# Patient Record
Sex: Female | Born: 1985 | Race: Black or African American | Hispanic: No | Marital: Married | Smoking: Current every day smoker
Health system: Southern US, Community
[De-identification: ages and names within clinical notes are randomized; demographics above are authoritative.]

## PROBLEM LIST (undated history)

## (undated) DIAGNOSIS — N83202 Unspecified ovarian cyst, left side: Secondary | ICD-10-CM

## (undated) DIAGNOSIS — K219 Gastro-esophageal reflux disease without esophagitis: Secondary | ICD-10-CM

## (undated) DIAGNOSIS — N83201 Unspecified ovarian cyst, right side: Secondary | ICD-10-CM

## (undated) DIAGNOSIS — M0520 Rheumatoid vasculitis with rheumatoid arthritis of unspecified site: Secondary | ICD-10-CM

## (undated) DIAGNOSIS — E559 Vitamin D deficiency, unspecified: Secondary | ICD-10-CM

## (undated) DIAGNOSIS — F419 Anxiety disorder, unspecified: Secondary | ICD-10-CM

## (undated) DIAGNOSIS — B009 Herpesviral infection, unspecified: Secondary | ICD-10-CM

## (undated) DIAGNOSIS — M25532 Pain in left wrist: Secondary | ICD-10-CM

## (undated) DIAGNOSIS — J45909 Unspecified asthma, uncomplicated: Secondary | ICD-10-CM

## (undated) DIAGNOSIS — M797 Fibromyalgia: Secondary | ICD-10-CM

## (undated) HISTORY — DX: Fibromyalgia: M79.7

## (undated) HISTORY — PX: THYROIDECTOMY: SHX17

## (undated) HISTORY — DX: Pain in left wrist: M25.532

## (undated) HISTORY — DX: Unspecified asthma, uncomplicated: J45.909

## (undated) HISTORY — DX: Vitamin D deficiency, unspecified: E55.9

## (undated) HISTORY — DX: Gastro-esophageal reflux disease without esophagitis: K21.9

## (undated) HISTORY — PX: ABDOMINAL HYSTERECTOMY: SHX81

---
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
## 2004-04-04 ENCOUNTER — Ambulatory Visit (HOSPITAL_COMMUNITY): Admission: RE | Admit: 2004-04-04 | Discharge: 2004-04-04 | Payer: Self-pay | Admitting: Obstetrics and Gynecology

## 2004-07-29 ENCOUNTER — Ambulatory Visit (HOSPITAL_COMMUNITY): Admission: AD | Admit: 2004-07-29 | Discharge: 2004-07-29 | Payer: Self-pay | Admitting: Obstetrics and Gynecology

## 2004-08-31 ENCOUNTER — Ambulatory Visit (HOSPITAL_COMMUNITY): Admission: AD | Admit: 2004-08-31 | Discharge: 2004-08-31 | Payer: Self-pay | Admitting: Obstetrics and Gynecology

## 2004-09-02 ENCOUNTER — Inpatient Hospital Stay (HOSPITAL_COMMUNITY): Admission: AD | Admit: 2004-09-02 | Discharge: 2004-09-05 | Payer: Self-pay | Admitting: Obstetrics and Gynecology

## 2004-09-02 ENCOUNTER — Ambulatory Visit (HOSPITAL_COMMUNITY): Admission: AD | Admit: 2004-09-02 | Discharge: 2004-09-02 | Payer: Self-pay | Admitting: Obstetrics and Gynecology

## 2004-10-28 ENCOUNTER — Emergency Department (HOSPITAL_COMMUNITY): Admission: EM | Admit: 2004-10-28 | Discharge: 2004-10-28 | Payer: Self-pay | Admitting: Emergency Medicine

## 2004-11-18 ENCOUNTER — Emergency Department (HOSPITAL_COMMUNITY): Admission: EM | Admit: 2004-11-18 | Discharge: 2004-11-18 | Payer: Self-pay | Admitting: Emergency Medicine

## 2005-03-16 ENCOUNTER — Ambulatory Visit: Admission: RE | Admit: 2005-03-16 | Discharge: 2005-03-16 | Payer: Self-pay | Admitting: Obstetrics and Gynecology

## 2005-03-18 ENCOUNTER — Ambulatory Visit: Payer: Self-pay | Admitting: Pulmonary Disease

## 2006-05-31 ENCOUNTER — Emergency Department (HOSPITAL_COMMUNITY): Admission: EM | Admit: 2006-05-31 | Discharge: 2006-05-31 | Payer: Self-pay | Admitting: Emergency Medicine

## 2006-12-29 ENCOUNTER — Emergency Department (HOSPITAL_COMMUNITY): Admission: EM | Admit: 2006-12-29 | Discharge: 2006-12-29 | Payer: Self-pay | Admitting: Emergency Medicine

## 2007-09-13 ENCOUNTER — Emergency Department (HOSPITAL_COMMUNITY): Admission: EM | Admit: 2007-09-13 | Discharge: 2007-09-13 | Payer: Self-pay | Admitting: Emergency Medicine

## 2008-01-29 ENCOUNTER — Emergency Department (HOSPITAL_COMMUNITY): Admission: EM | Admit: 2008-01-29 | Discharge: 2008-01-29 | Payer: Self-pay | Admitting: Emergency Medicine

## 2008-07-20 ENCOUNTER — Emergency Department (HOSPITAL_COMMUNITY): Admission: EM | Admit: 2008-07-20 | Discharge: 2008-07-20 | Payer: Self-pay | Admitting: Emergency Medicine

## 2008-08-06 ENCOUNTER — Emergency Department (HOSPITAL_COMMUNITY): Admission: EM | Admit: 2008-08-06 | Discharge: 2008-08-06 | Payer: Self-pay | Admitting: Emergency Medicine

## 2008-08-27 ENCOUNTER — Other Ambulatory Visit: Admission: RE | Admit: 2008-08-27 | Discharge: 2008-08-27 | Payer: Self-pay | Admitting: Obstetrics and Gynecology

## 2008-10-31 ENCOUNTER — Emergency Department (HOSPITAL_COMMUNITY): Admission: EM | Admit: 2008-10-31 | Discharge: 2008-10-31 | Payer: Self-pay | Admitting: Emergency Medicine

## 2008-12-12 ENCOUNTER — Encounter (INDEPENDENT_AMBULATORY_CARE_PROVIDER_SITE_OTHER): Payer: Self-pay | Admitting: *Deleted

## 2009-02-14 ENCOUNTER — Inpatient Hospital Stay (HOSPITAL_COMMUNITY): Admission: AD | Admit: 2009-02-14 | Discharge: 2009-02-21 | Payer: Self-pay | Admitting: Obstetrics and Gynecology

## 2009-02-14 ENCOUNTER — Ambulatory Visit: Payer: Self-pay | Admitting: Family Medicine

## 2009-05-23 ENCOUNTER — Emergency Department (HOSPITAL_COMMUNITY): Admission: EM | Admit: 2009-05-23 | Discharge: 2009-05-23 | Payer: Self-pay | Admitting: Emergency Medicine

## 2009-08-25 ENCOUNTER — Emergency Department (HOSPITAL_COMMUNITY): Admission: EM | Admit: 2009-08-25 | Discharge: 2009-08-25 | Payer: Self-pay | Admitting: Emergency Medicine

## 2010-01-11 ENCOUNTER — Emergency Department (HOSPITAL_COMMUNITY): Admission: EM | Admit: 2010-01-11 | Discharge: 2010-01-11 | Payer: Self-pay | Admitting: Emergency Medicine

## 2010-02-12 ENCOUNTER — Encounter: Payer: Self-pay | Admitting: Obstetrics & Gynecology

## 2010-02-12 ENCOUNTER — Ambulatory Visit (HOSPITAL_COMMUNITY): Admission: RE | Admit: 2010-02-12 | Discharge: 2010-02-13 | Payer: Self-pay | Admitting: Obstetrics & Gynecology

## 2010-02-14 ENCOUNTER — Emergency Department (HOSPITAL_COMMUNITY): Admission: EM | Admit: 2010-02-14 | Discharge: 2010-02-14 | Payer: Self-pay | Admitting: Emergency Medicine

## 2010-04-28 ENCOUNTER — Inpatient Hospital Stay (HOSPITAL_COMMUNITY)
Admission: EM | Admit: 2010-04-28 | Discharge: 2010-04-28 | Payer: Self-pay | Source: Home / Self Care | Attending: Family Medicine | Admitting: Family Medicine

## 2010-07-10 ENCOUNTER — Emergency Department (HOSPITAL_COMMUNITY): Payer: Medicaid Other

## 2010-07-10 ENCOUNTER — Emergency Department (HOSPITAL_COMMUNITY)
Admission: EM | Admit: 2010-07-10 | Discharge: 2010-07-10 | Disposition: A | Discharge status: Home / Self Care | Payer: Medicaid Other | Attending: Emergency Medicine | Admitting: Emergency Medicine

## 2010-07-10 DIAGNOSIS — R0609 Other forms of dyspnea: Secondary | ICD-10-CM | POA: Insufficient documentation

## 2010-07-10 DIAGNOSIS — N390 Urinary tract infection, site not specified: Secondary | ICD-10-CM | POA: Insufficient documentation

## 2010-07-10 DIAGNOSIS — R059 Cough, unspecified: Secondary | ICD-10-CM | POA: Insufficient documentation

## 2010-07-10 DIAGNOSIS — R0989 Other specified symptoms and signs involving the circulatory and respiratory systems: Secondary | ICD-10-CM | POA: Insufficient documentation

## 2010-07-10 DIAGNOSIS — J45901 Unspecified asthma with (acute) exacerbation: Secondary | ICD-10-CM | POA: Insufficient documentation

## 2010-07-10 DIAGNOSIS — R05 Cough: Secondary | ICD-10-CM | POA: Insufficient documentation

## 2010-07-10 DIAGNOSIS — R071 Chest pain on breathing: Secondary | ICD-10-CM | POA: Insufficient documentation

## 2010-07-10 LAB — URINALYSIS, ROUTINE W REFLEX MICROSCOPIC
Nitrite: POSITIVE — AB
Protein, ur: NEGATIVE mg/dL
Urobilinogen, UA: 1 mg/dL (ref 0.0–1.0)

## 2010-07-10 LAB — URINE MICROSCOPIC-ADD ON

## 2010-07-10 LAB — POCT PREGNANCY, URINE: Preg Test, Ur: NEGATIVE

## 2010-07-29 LAB — URINALYSIS, ROUTINE W REFLEX MICROSCOPIC
Glucose, UA: NEGATIVE mg/dL
Hgb urine dipstick: NEGATIVE
Ketones, ur: NEGATIVE mg/dL
Protein, ur: NEGATIVE mg/dL
pH: 5.5 (ref 5.0–8.0)

## 2010-07-29 LAB — WET PREP, GENITAL
Clue Cells Wet Prep HPF POC: NONE SEEN
Trich, Wet Prep: NONE SEEN
Yeast Wet Prep HPF POC: NONE SEEN

## 2010-07-29 LAB — GC/CHLAMYDIA PROBE AMP, GENITAL: GC Probe Amp, Genital: NEGATIVE

## 2010-07-31 LAB — GC/CHLAMYDIA PROBE AMP, GENITAL
Chlamydia, DNA Probe: NEGATIVE
GC Probe Amp, Genital: NEGATIVE

## 2010-07-31 LAB — COMPREHENSIVE METABOLIC PANEL
AST: 15 U/L (ref 0–37)
Albumin: 3.6 g/dL (ref 3.5–5.2)
BUN: 2 mg/dL — ABNORMAL LOW (ref 6–23)
Chloride: 106 mEq/L (ref 96–112)
Creatinine, Ser: 0.71 mg/dL (ref 0.4–1.2)
GFR calc Af Amer: >60 mL/min (ref >60)
Total Bilirubin: 0.5 mg/dL (ref 0.3–1.2)
Total Protein: 6.7 g/dL (ref 6.0–8.3)

## 2010-07-31 LAB — URINALYSIS, ROUTINE W REFLEX MICROSCOPIC
Bilirubin Urine: NEGATIVE
Bilirubin Urine: NEGATIVE
Glucose, UA: NEGATIVE mg/dL
Ketones, ur: NEGATIVE mg/dL
Nitrite: NEGATIVE
Protein, ur: NEGATIVE mg/dL
Specific Gravity, Urine: 1.01 (ref 1.005–1.030)
Urobilinogen, UA: 1 mg/dL (ref 0.0–1.0)
Urobilinogen, UA: 1 mg/dL (ref 0.0–1.0)
pH: 8 (ref 5.0–8.0)

## 2010-07-31 LAB — CBC
Hemoglobin: 12.4 g/dL (ref 12.0–15.0)
MCH: 26.3 pg (ref 26.0–34.0)
MCH: 27 pg (ref 26.0–34.0)
MCHC: 32.9 g/dL (ref 30.0–36.0)
MCV: 82.1 fL (ref 78.0–100.0)
Platelets: 348 10*3/uL (ref 150–400)
RBC: 4.03 MIL/uL (ref 3.87–5.11)
RBC: 4.59 MIL/uL (ref 3.87–5.11)
RDW: 14.6 % (ref 11.5–15.5)
WBC: 6.4 10*3/uL (ref 4.0–10.5)

## 2010-07-31 LAB — URINE MICROSCOPIC-ADD ON

## 2010-07-31 LAB — SURGICAL PCR SCREEN
MRSA, PCR: NEGATIVE
Staphylococcus aureus: NEGATIVE

## 2010-07-31 LAB — DIFFERENTIAL
Basophils Relative: 1 % (ref 0–1)
Eosinophils Absolute: 0.3 10*3/uL (ref 0.0–0.7)
Monocytes Absolute: 0.4 10*3/uL (ref 0.1–1.0)
Neutrophils Relative %: 64 % (ref 43–77)

## 2010-07-31 LAB — BASIC METABOLIC PANEL
CO2: 27 mEq/L (ref 19–32)
Chloride: 106 mEq/L (ref 96–112)
Creatinine, Ser: 0.82 mg/dL (ref 0.4–1.2)
GFR calc Af Amer: >60 mL/min (ref >60)
Glucose, Bld: 86 mg/dL (ref 70–99)

## 2010-07-31 LAB — TYPE AND SCREEN
ABO/RH(D): B POS
Antibody Screen: NEGATIVE

## 2010-07-31 LAB — WET PREP, GENITAL
Trich, Wet Prep: NONE SEEN
Yeast Wet Prep HPF POC: NONE SEEN

## 2010-08-03 LAB — DIFFERENTIAL
Basophils Absolute: 0 10*3/uL (ref 0.0–0.1)
Basophils Relative: 0 % (ref 0–1)
Eosinophils Relative: 4 % (ref 0–5)
Lymphocytes Relative: 30 % (ref 12–46)
Neutro Abs: 4 10*3/uL (ref 1.7–7.7)

## 2010-08-03 LAB — URINALYSIS, ROUTINE W REFLEX MICROSCOPIC
Bilirubin Urine: NEGATIVE
Glucose, UA: NEGATIVE mg/dL
Ketones, ur: NEGATIVE mg/dL
Leukocytes, UA: NEGATIVE
Protein, ur: NEGATIVE mg/dL

## 2010-08-03 LAB — COMPREHENSIVE METABOLIC PANEL
Alkaline Phosphatase: 76 U/L (ref 39–117)
BUN: 9 mg/dL (ref 6–23)
CO2: 27 mEq/L (ref 19–32)
Chloride: 104 mEq/L (ref 96–112)
Creatinine, Ser: 0.68 mg/dL (ref 0.4–1.2)
GFR calc non Af Amer: >60 mL/min (ref >60)
Total Bilirubin: 0.3 mg/dL (ref 0.3–1.2)

## 2010-08-03 LAB — CBC
HCT: 41.4 % (ref 36.0–46.0)
Hemoglobin: 13.7 g/dL (ref 12.0–15.0)
MCV: 82.2 fL (ref 78.0–100.0)
RBC: 5.03 MIL/uL (ref 3.87–5.11)
WBC: 6.4 10*3/uL (ref 4.0–10.5)

## 2010-08-03 LAB — LIPASE, BLOOD: Lipase: 30 U/L (ref 11–59)

## 2010-08-06 LAB — URINALYSIS, ROUTINE W REFLEX MICROSCOPIC
Hgb urine dipstick: NEGATIVE
Ketones, ur: NEGATIVE mg/dL
Protein, ur: NEGATIVE mg/dL
Urobilinogen, UA: 0.2 mg/dL (ref 0.0–1.0)

## 2010-08-21 LAB — CBC
HCT: 31 % — ABNORMAL LOW (ref 36.0–46.0)
MCV: 83.9 fL (ref 78.0–100.0)
Platelets: 256 10*3/uL (ref 150–400)
RDW: 14.8 % (ref 11.5–15.5)
WBC: 15.3 10*3/uL — ABNORMAL HIGH (ref 4.0–10.5)

## 2010-08-21 LAB — RPR: RPR Ser Ql: NONREACTIVE

## 2010-08-22 LAB — STREP B DNA PROBE: Strep Group B Ag: POSITIVE

## 2010-08-22 LAB — GC/CHLAMYDIA PROBE AMP, URINE: Chlamydia, Swab/Urine, PCR: NEGATIVE

## 2010-08-25 LAB — DIFFERENTIAL
Basophils Absolute: 0 10*3/uL (ref 0.0–0.1)
Eosinophils Absolute: 0.1 10*3/uL (ref 0.0–0.7)
Eosinophils Relative: 1 % (ref 0–5)
Lymphocytes Relative: 16 % (ref 12–46)
Lymphs Abs: 1.6 10*3/uL (ref 0.7–4.0)
Monocytes Absolute: 0.5 10*3/uL (ref 0.1–1.0)

## 2010-08-25 LAB — URINALYSIS, ROUTINE W REFLEX MICROSCOPIC
Glucose, UA: NEGATIVE mg/dL
Ketones, ur: NEGATIVE mg/dL
Nitrite: NEGATIVE
Specific Gravity, Urine: 1.015 (ref 1.005–1.030)
pH: 7.5 (ref 5.0–8.0)

## 2010-08-25 LAB — CBC
HCT: 34.1 % — ABNORMAL LOW (ref 36.0–46.0)
Hemoglobin: 12 g/dL (ref 12.0–15.0)
MCV: 83.5 fL (ref 78.0–100.0)
Platelets: 268 10*3/uL (ref 150–400)
RDW: 15.4 % (ref 11.5–15.5)

## 2010-08-25 LAB — URINE MICROSCOPIC-ADD ON

## 2010-08-25 LAB — BASIC METABOLIC PANEL
BUN: 2 mg/dL — ABNORMAL LOW (ref 6–23)
CO2: 27 mEq/L (ref 19–32)
Chloride: 105 mEq/L (ref 96–112)
GFR calc non Af Amer: >60 mL/min (ref >60)
Glucose, Bld: 76 mg/dL (ref 70–99)
Potassium: 3.9 mEq/L (ref 3.5–5.1)
Sodium: 136 mEq/L (ref 135–145)

## 2010-08-25 LAB — URINE CULTURE

## 2010-08-28 ENCOUNTER — Emergency Department (HOSPITAL_COMMUNITY)
Admission: EM | Admit: 2010-08-28 | Discharge: 2010-08-28 | Disposition: A | Discharge status: Home / Self Care | Payer: Medicaid Other | Attending: Emergency Medicine | Admitting: Emergency Medicine

## 2010-08-28 DIAGNOSIS — J309 Allergic rhinitis, unspecified: Secondary | ICD-10-CM | POA: Insufficient documentation

## 2010-08-28 DIAGNOSIS — J45909 Unspecified asthma, uncomplicated: Secondary | ICD-10-CM | POA: Insufficient documentation

## 2010-08-28 DIAGNOSIS — F17200 Nicotine dependence, unspecified, uncomplicated: Secondary | ICD-10-CM | POA: Insufficient documentation

## 2010-08-28 DIAGNOSIS — Z9109 Other allergy status, other than to drugs and biological substances: Secondary | ICD-10-CM | POA: Insufficient documentation

## 2010-08-28 DIAGNOSIS — R300 Dysuria: Secondary | ICD-10-CM | POA: Insufficient documentation

## 2010-08-28 LAB — URINALYSIS, ROUTINE W REFLEX MICROSCOPIC
Bilirubin Urine: NEGATIVE
Bilirubin Urine: NEGATIVE
Glucose, UA: NEGATIVE mg/dL
Ketones, ur: NEGATIVE mg/dL
Ketones, ur: NEGATIVE mg/dL
Leukocytes, UA: NEGATIVE
Nitrite: POSITIVE — AB
Protein, ur: NEGATIVE mg/dL
Protein, ur: NEGATIVE mg/dL
Urobilinogen, UA: 0.2 mg/dL (ref 0.0–1.0)
pH: 6 (ref 5.0–8.0)

## 2010-08-28 LAB — HCG, QUANTITATIVE, PREGNANCY: hCG, Beta Chain, Quant, S: 31384 m[IU]/mL — ABNORMAL HIGH (ref <5)

## 2010-08-28 LAB — WET PREP, GENITAL
Clue Cells Wet Prep HPF POC: NONE SEEN
Trich, Wet Prep: NONE SEEN
WBC, Wet Prep HPF POC: NONE SEEN
Yeast Wet Prep HPF POC: NONE SEEN

## 2010-08-28 LAB — GC/CHLAMYDIA PROBE AMP, GENITAL: Chlamydia, DNA Probe: NEGATIVE

## 2010-08-29 LAB — GC/CHLAMYDIA PROBE AMP, GENITAL: GC Probe Amp, Genital: NEGATIVE

## 2010-09-08 ENCOUNTER — Emergency Department (HOSPITAL_COMMUNITY)
Admission: EM | Admit: 2010-09-08 | Discharge: 2010-09-09 | Disposition: A | Discharge status: Home / Self Care | Payer: Medicaid Other | Attending: Emergency Medicine | Admitting: Emergency Medicine

## 2010-09-08 ENCOUNTER — Emergency Department (HOSPITAL_COMMUNITY): Payer: Medicaid Other

## 2010-09-08 DIAGNOSIS — R0602 Shortness of breath: Secondary | ICD-10-CM | POA: Insufficient documentation

## 2010-09-08 DIAGNOSIS — R071 Chest pain on breathing: Secondary | ICD-10-CM | POA: Insufficient documentation

## 2010-09-08 DIAGNOSIS — R059 Cough, unspecified: Secondary | ICD-10-CM | POA: Insufficient documentation

## 2010-09-08 DIAGNOSIS — R05 Cough: Secondary | ICD-10-CM | POA: Insufficient documentation

## 2010-09-08 DIAGNOSIS — J45909 Unspecified asthma, uncomplicated: Secondary | ICD-10-CM | POA: Insufficient documentation

## 2010-09-08 DIAGNOSIS — J309 Allergic rhinitis, unspecified: Secondary | ICD-10-CM | POA: Insufficient documentation

## 2010-09-09 LAB — POCT I-STAT, CHEM 8
Calcium, Ion: 1.23 mmol/L (ref 1.12–1.32)
Glucose, Bld: 95 mg/dL (ref 70–99)
HCT: 43 % (ref 36.0–46.0)
Hemoglobin: 14.6 g/dL (ref 12.0–15.0)

## 2010-09-09 LAB — POCT CARDIAC MARKERS: Troponin i, poc: <0.05 ng/mL (ref 0.00–0.09)

## 2010-10-03 NOTE — Op Note (Signed)
NAME:  Janice Pena, Janice Pena NO.:  [id number]   MEDICAL RECORD NO.:  [id number]          PATIENT TYPE:  INP   LOCATION:  LDR1                          FACILITY:  [facility]   PHYSICIAN:  Tilda Burrow, M.D. DATE OF BIRTH:  Mar 26, 1986   DATE OF PROCEDURE:  09/03/2004  DATE OF DISCHARGE:                                  PROCEDURE NOTE   Onset of labor September 03, 2004.  Date of delivery September 03, 2004 at 1346 p.m.  Length of first stage labor 4 hours and 16 minutes, length of second stage  labor 60 minutes, length of third stage labor 5 minutes.   DELIVERY NOTE:  Under epidural anesthesia, [name] had a normal spontaneous  vaginal delivery of a viable female infant.  On delivery of infant, it was  thoroughly suctioned and dried.  With delivery, infant had good movement of  all extremities, good tone, strong cry.  The infant was thoroughly dried and  suctioned and placed upon her mother's abdomen for newborn care.  Cord was  clamped and cut.  Upon inspection, perineum was noted to be intact with two  first degree left and right labial lacerations noted with no repair needed.  Good hemostasis was obtained.  There was a small left labial hematoma  approximately 1.5 cm with no further swelling since point of delivery, and  that has been approximately 25 minutes _since delivery__.  Third stage labor  was actually managed with 20 units Pitocin and 500 mL lactated Ringers at  rapid rate.  Placenta was delivered spontaneous via Tomasa Blase mechanism.  Cord  blood gas and cord blood obtained and sent to lab.  On final inspection,  three vessel cord noted.  Membranes were noted to be intact.  Estimated  blood loss approximately 250 mL.  Epidural catheter was removed with blue  tip intact.  The patient and infant stabilized and transferred out to  postpartum unit in good condition.      DL/MEDQ  D:  29/56/2130  T:  09/03/2004  Job:  [id number]

## 2010-10-03 NOTE — Procedures (Signed)
NAME:  Janice Pena, Janice Pena NO.:  [id number]   MEDICAL RECORD NO.:  [id number]          PATIENT TYPE:  OUT   LOCATION:  SLEEP LAB                     FACILITY:  [facility]   PHYSICIAN:  Marcelyn Bruins, M.D. [facility] DATE OF BIRTH:  12-20-1985   DATE OF STUDY:  03/16/2005                              NOCTURNAL POLYSOMNOGRAM   REFERRING PHYSICIAN:  Dr. Christin Bach.   DATE OF STUDY:  March 16, 2005.   INDICATION FOR STUDY:  Hypersomnia with sleep apnea.   EPWORTH SCORE:  7.   SLEEP ARCHITECTURE:  The patient total sleep time of 358 minutes with  adequate slow wave sleep but decreased REM. Sleep onset latency was at the  upper limits of normal and REM onset was prolonged at 171 minutes. Sleep  efficiency was 93%.   RESPIRATORY DATA:  The patient was found to have 2 hypopneas and 1  obstructive apnea for a respiratory disturbance index of less than 1 per  hour. The patient therefore did not meet split night criteria. The patient  was noted however have mild to moderate snoring and moderate numbers of  nonspecific arousals. 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   OXYGEN DATA:  The patient did have 1 isolated O2 desaturation during REM to  85% that really was not clinically significant.   CARDIAC DATA:  No clinically significant cardiac arrhythmias.   MOVEMENT/PARASOMNIAS:  The patient was found to have 95 leg jerks during the  study with 4 per hour resulting in arousal or awakening. This is clinically  significant and may indicate either the restless leg syndrome or the  periodic leg movement syndrome. Given the small numbers of respiratory  events, this may be a much greater factor with regards to sleep disruption.  Clinical correlation is suggested.   IMPRESSION/RECOMMENDATION:  1.  Small numbers of obstructive events which do not meet the respiratory      disturbance index criteria for the obstructive sleep apnea syndrome. 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 Weight loss may help with this if      applicable.  2.  Moderate to large numbers of leg jerks with significant sleep      disruption. This appears to be clinically significant and may be the      etiology of the patient's sleep disruption.                                            ______________________________  Marcelyn Bruins, M.D. [facility]  Diplomate, American Board of Sleep  Medicine     KC/MEDQ  D:  03/18/2005 16:52:52  T:  03/18/2005 23:53:38  Job:  [id number]

## 2010-10-03 NOTE — Op Note (Signed)
NAME:  Janice Pena, Janice Pena NO.:  [id number]   MEDICAL RECORD NO.:  [id number]          PATIENT TYPE:  INP   LOCATION:  A413                          FACILITY:  [facility]   PHYSICIAN:  Tilda Burrow, M.D. DATE OF BIRTH:  [birth year]   DATE OF PROCEDURE:  09/03/2004  DATE OF DISCHARGE:                                 OPERATIVE REPORT   PROCEDURE PERFORMED:  Epidural catheter placement.   INDICATIONS FOR PROCEDURE:  Elective induction, requesting epidural  analgesia.   DESCRIPTION OF PROCEDURE:  Ms. Janice Pena progressed nicely to 5 cm, requested  epidural and after fluid hydration, was placed in sitting position, flexed  forward with loss of resistance technique used after prepping and draping of  the back.  The L3-4 interspace was utilized, with the epidural space  identified at 5 cm depth beneath the skin on first attempt with 5 mL test  dose of 1.5% lidocaine with epinephrine.  Epidural catheter was threaded 3  cm into the epidural space with bolus of 10 mL of the 0.125% Marcaine  (bupivacaine) solution infused as a bolus and 12 mL per hour continuous  infusion established. The patient had symmetric analgesic effect with good  relief of pain.      JVF/MEDQ  D:  09/04/2004  T:  09/04/2004  Job:  [id number]

## 2010-10-03 NOTE — H&P (Signed)
NAME:  Janice Pena, LUEBBE NO.:  [id number]   MEDICAL RECORD NO.:  [id number]          PATIENT TYPE:  OIB   LOCATION:                                FACILITY:  [facility]   PHYSICIAN:  Tilda Burrow, M.D. DATE OF BIRTH:  [birth year]   DATE OF ADMISSION:  DATE OF DISCHARGE:  LH                                HISTORY & PHYSICAL   REASON FOR ADMISSION:  Pregnancy, 39 weeks, 4 days for elective induction  labor due to maternal fatigue and discomfort.  Discussed risks and benefits  of elective induction of labor, patient agrees and continues to desire  induction.  She is well aware of the risks and wants to continue with Foley  bulb induction.   PAST MEDICAL HISTORY:  Positive for asthma.   PAST SURGICAL HISTORY:  Negative.   ALLERGIES:  She is allergic to PENICILLIN.   MEDICATIONS:  She is albuterol inhaler and a nebulizer treatment as needed.   SOCIAL HISTORY:  She is single but father is supportive and her mother is  also supportive.   OBSTETRICAL HISTORY:  Her prenatal course has essentially been uneventful.  Blood type is B positive, rubella is immune, Hepatitis B surface antigen is  negative.  HIV is negative. HSV is negative.  Serology is nonreactive.  Pap  smear is within normal limits.  GC and Chlamydia are negative.  AFP normal.  GBS is negative.  28 week hemoglobin 12.3, 28 week hematocrit 37.0. One hour  glucose 67.  Sickle cell screen is negative.   PHYSICAL EXAMINATION:  VITAL SIGNS:  Today her weight is 192, blood pressure  118/74. Fundal height 38 cm.  Fetal heart rate 140's  She does have  decreased fetal movements so I am sending her over to the hospital for a non-  stress test for evaluation.  PELVIC:  Cervix is 1 cm, 70%, minus 1 and the cervix is anterior.   PLAN:  We are going to admission for Foley bulb induction of labor.      DL/MEDQ  D:  16/02/9603  T:  09/02/2004  Job:  [id number]

## 2010-11-08 ENCOUNTER — Emergency Department (HOSPITAL_COMMUNITY)
Admission: EM | Admit: 2010-11-08 | Discharge: 2010-11-08 | Disposition: A | Discharge status: Home / Self Care | Payer: Self-pay | Attending: Emergency Medicine | Admitting: Emergency Medicine

## 2010-11-08 DIAGNOSIS — F411 Generalized anxiety disorder: Secondary | ICD-10-CM | POA: Insufficient documentation

## 2010-11-08 DIAGNOSIS — J45909 Unspecified asthma, uncomplicated: Secondary | ICD-10-CM | POA: Insufficient documentation

## 2010-11-08 DIAGNOSIS — N12 Tubulo-interstitial nephritis, not specified as acute or chronic: Secondary | ICD-10-CM | POA: Insufficient documentation

## 2010-11-08 LAB — URINALYSIS, ROUTINE W REFLEX MICROSCOPIC
Glucose, UA: NEGATIVE mg/dL
Nitrite: POSITIVE — AB
Protein, ur: NEGATIVE mg/dL
pH: 7 (ref 5.0–8.0)

## 2010-11-08 LAB — URINE MICROSCOPIC-ADD ON

## 2010-11-08 LAB — POCT PREGNANCY, URINE: Preg Test, Ur: NEGATIVE

## 2010-11-11 LAB — URINE CULTURE

## 2011-05-01 ENCOUNTER — Emergency Department (HOSPITAL_COMMUNITY)
Admission: EM | Admit: 2011-05-01 | Discharge: 2011-05-01 | Disposition: A | Discharge status: Home / Self Care | Payer: Self-pay | Attending: Emergency Medicine | Admitting: Emergency Medicine

## 2011-05-01 DIAGNOSIS — R059 Cough, unspecified: Secondary | ICD-10-CM | POA: Insufficient documentation

## 2011-05-01 DIAGNOSIS — F17200 Nicotine dependence, unspecified, uncomplicated: Secondary | ICD-10-CM | POA: Insufficient documentation

## 2011-05-01 DIAGNOSIS — F411 Generalized anxiety disorder: Secondary | ICD-10-CM | POA: Insufficient documentation

## 2011-05-01 DIAGNOSIS — J45909 Unspecified asthma, uncomplicated: Secondary | ICD-10-CM | POA: Insufficient documentation

## 2011-05-01 DIAGNOSIS — R05 Cough: Secondary | ICD-10-CM | POA: Insufficient documentation

## 2011-05-01 DIAGNOSIS — N390 Urinary tract infection, site not specified: Secondary | ICD-10-CM | POA: Insufficient documentation

## 2011-05-01 HISTORY — DX: Anxiety disorder, unspecified: F41.9

## 2011-05-01 LAB — URINALYSIS, ROUTINE W REFLEX MICROSCOPIC
Glucose, UA: NEGATIVE mg/dL
Ketones, ur: NEGATIVE mg/dL
Nitrite: POSITIVE — AB
Protein, ur: 30 mg/dL — AB
Urobilinogen, UA: 0.2 mg/dL (ref 0.0–1.0)

## 2011-05-01 LAB — URINE MICROSCOPIC-ADD ON

## 2011-05-01 MED ORDER — CIPROFLOXACIN HCL 500 MG PO TABS: 500.0000 mg | ORAL_TABLET | Freq: Two times a day (BID) | ORAL | Status: AC

## 2011-05-01 NOTE — ED Provider Notes (Signed)
History     CSN: [id number] Arrival date & time: 05/01/2011 12:53 PM   First MD Initiated Contact with Patient 05/01/11 1432      Chief Complaint  Patient presents with  . Cough  . Abdominal Pain    (Consider location/radiation/quality/duration/timing/severity/associated sxs/prior treatment) HPI Comments: Patient is a 25 year old woman who has developed pain in her back it radiates around into the abdomen. She has a prior history of urinary infection. She says she also has had a cough, it's been going on intermittently since October. She smokes 7-10 cigarettes per day. There is a prior history of urinary tract infection and asthma.  Patient is a 25 y.o. female presenting with flank pain. The history is provided by the patient. No language interpreter was used.  Flank Pain This is a new problem. The current episode started more than 2 days ago. The problem occurs constantly. The problem has been gradually worsening. Associated symptoms comments: She is a smoker and has some cough and congestion.  . The symptoms are aggravated by nothing. The symptoms are relieved by nothing. She has tried nothing for the symptoms.    Past Medical History  Diagnosis Date  . Asthma   . Anxiety     Past Surgical History  Procedure Date  . Abdominal hysterectomy     History reviewed. No pertinent family history.  History  Substance Use Topics  . Smoking status: Current Everyday Smoker -- 0.5 packs/day  . Smokeless tobacco: Not on file  . Alcohol Use: No    OB History    Grav Para Term Preterm Abortions TAB SAB Ect Mult Living                  Review of Systems  Constitutional: Negative.   HENT: Negative.   Eyes: Negative.   Respiratory: Positive for cough.   Cardiovascular: Negative.   Gastrointestinal: Negative.   Genitourinary: Positive for flank pain.  Musculoskeletal: Negative.   Skin: Negative.   Neurological: Negative.   Psychiatric/Behavioral: Negative.     Allergies    Review of patient's allergies indicates no known allergies.  Home Medications   Current Outpatient Rx  Name Route Sig Dispense Refill  . CITALOPRAM HYDROBROMIDE 10 MG PO TABS Oral Take 10 mg by mouth daily as needed. anxiety       BP 133/91  Pulse 93  Temp(Src) 98.5 F (36.9 C) (Oral)  Resp 18  SpO2 98%  Physical Exam  Constitutional: She is oriented to person, place, and time. She appears well-developed and well-nourished. No distress.  HENT:  Head: Normocephalic and atraumatic.  Right Ear: External ear normal.  Left Ear: External ear normal.  Mouth/Throat: Oropharynx is clear and moist.  Eyes: Conjunctivae and EOM are normal. Pupils are equal, round, and reactive to light.  Neck: Normal range of motion. Neck supple.  Cardiovascular: Normal rate, regular rhythm and normal heart sounds.   Pulmonary/Chest: Effort normal and breath sounds normal.  Abdominal: Soft. There is no tenderness. There is no rebound.  Musculoskeletal:       She has mild bilateral CVA tenderness.  Neurological: She is alert and oriented to person, place, and time.       No sensory or motor deficit.  Skin: Skin is dry.  Psychiatric: She has a normal mood and affect. Her behavior is normal.    ED Course  Procedures (including critical care time)  Labs Reviewed  URINALYSIS, ROUTINE W REFLEX MICROSCOPIC - Abnormal; Notable for the following:  APPearance HAZY (*)    Hgb urine dipstick MODERATE (*)    Protein, ur 30 (*)    Nitrite POSITIVE (*)    Leukocytes, UA MODERATE (*)    All other components within normal limits  URINE MICROSCOPIC-ADD ON - Abnormal; Notable for the following:    Squamous Epithelial / LPF FEW (*)    Bacteria, UA MANY (*)    All other components within normal limits   2:45 PM UA shows WBC TNTC.  Rx with cipro.   1. Urinary tract infection              Carleene Cooper III, MD 05/01/11 1451

## 2011-05-01 NOTE — ED Notes (Signed)
Pt presents with cough and congestions and left side pain that radiates to LUQ. Pt states she is experiencing nausea.

## 2011-12-14 ENCOUNTER — Ambulatory Visit
Admission: RE | Admit: 2011-12-14 | Discharge: 2011-12-14 | Disposition: A | Discharge status: Home / Self Care | Payer: No Typology Code available for payment source | Source: Ambulatory Visit | Attending: Infectious Diseases | Admitting: Infectious Diseases

## 2011-12-14 ENCOUNTER — Other Ambulatory Visit: Payer: Self-pay | Admitting: Infectious Diseases

## 2011-12-14 DIAGNOSIS — R7611 Nonspecific reaction to tuberculin skin test without active tuberculosis: Secondary | ICD-10-CM

## 2012-04-28 IMAGING — CR DG CHEST 2V
2 series · 2 of 2 positions shown · non-contrast
Comparison: CT chest 05/23/2009.

CLINICAL DATA: Chest pain.

CHEST - 2 VIEW

[w chest pa]
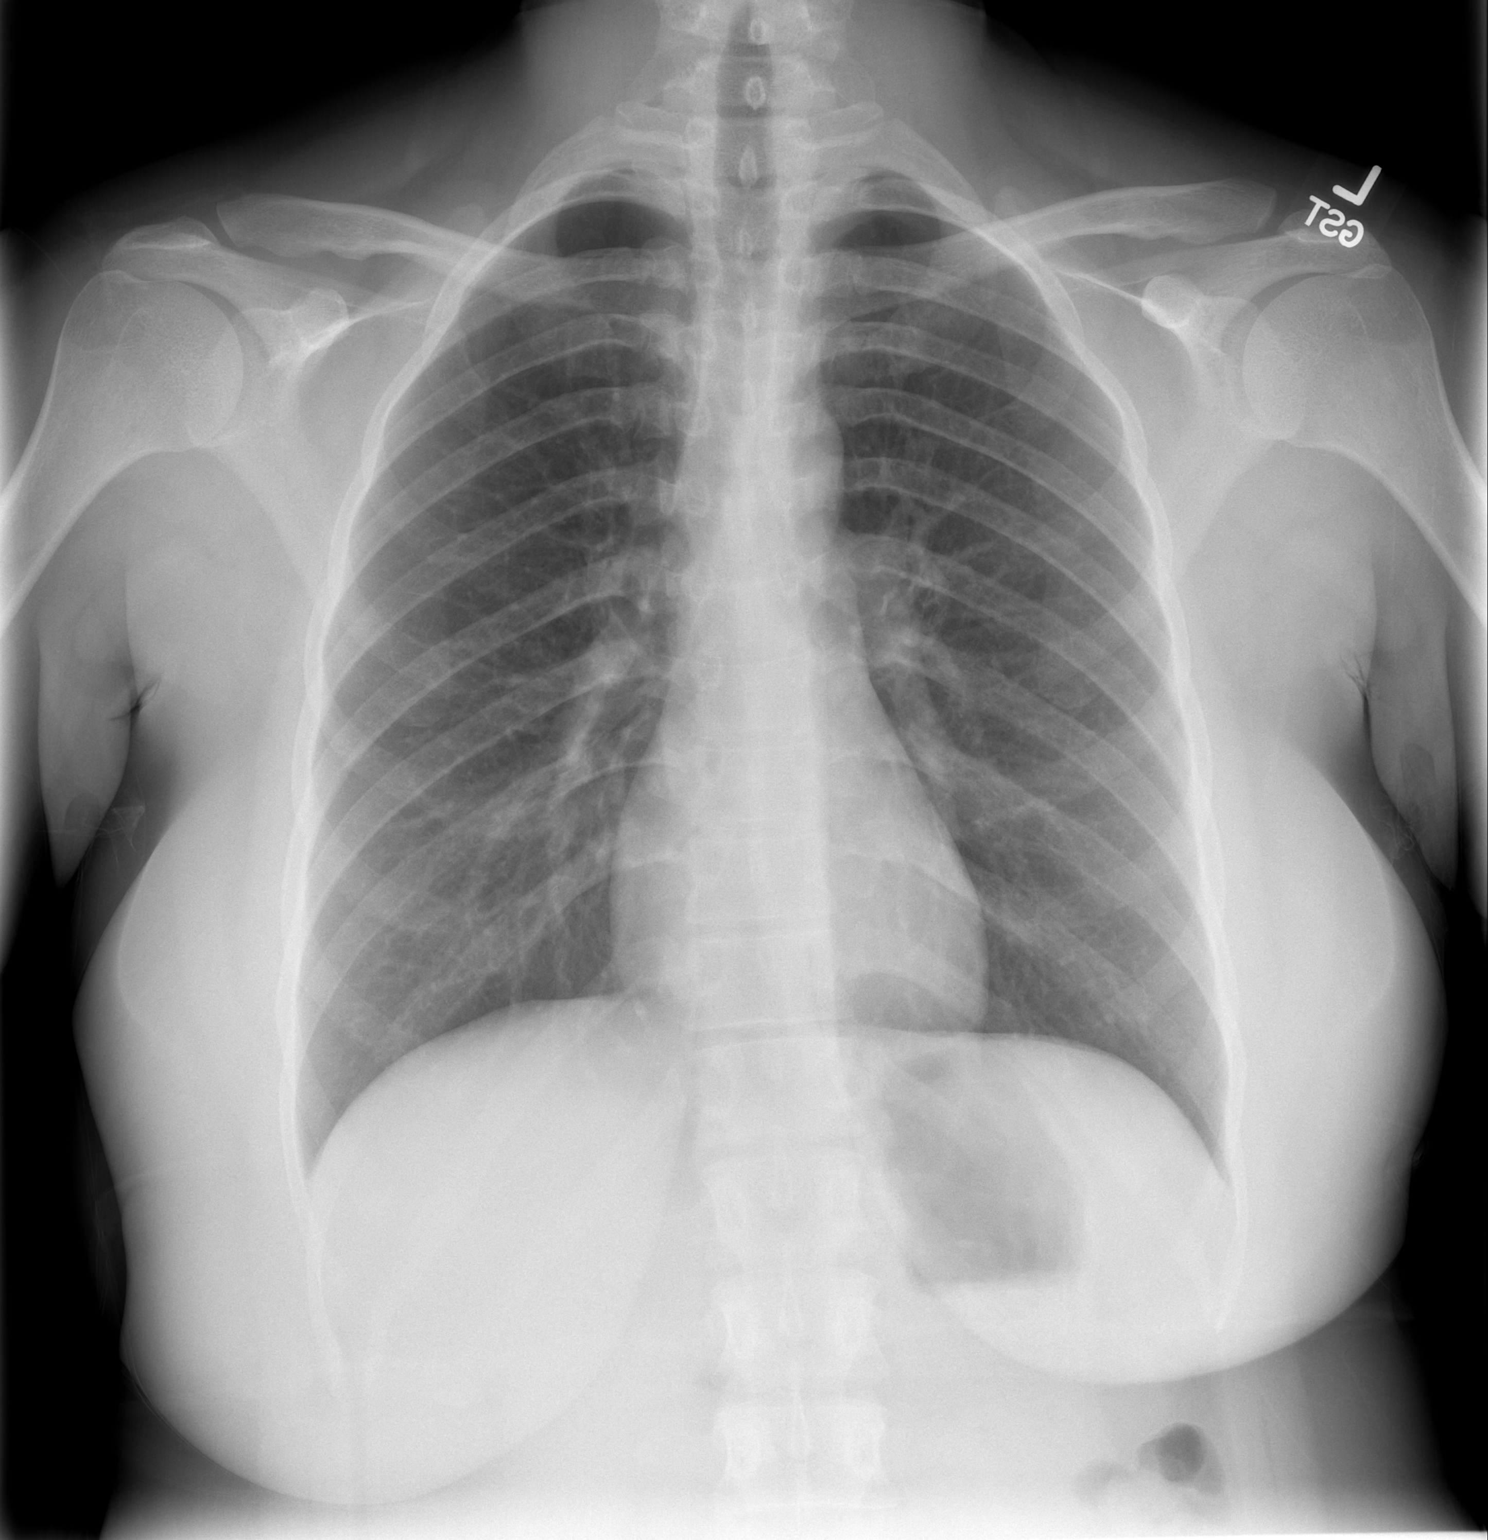

[w chest lat]
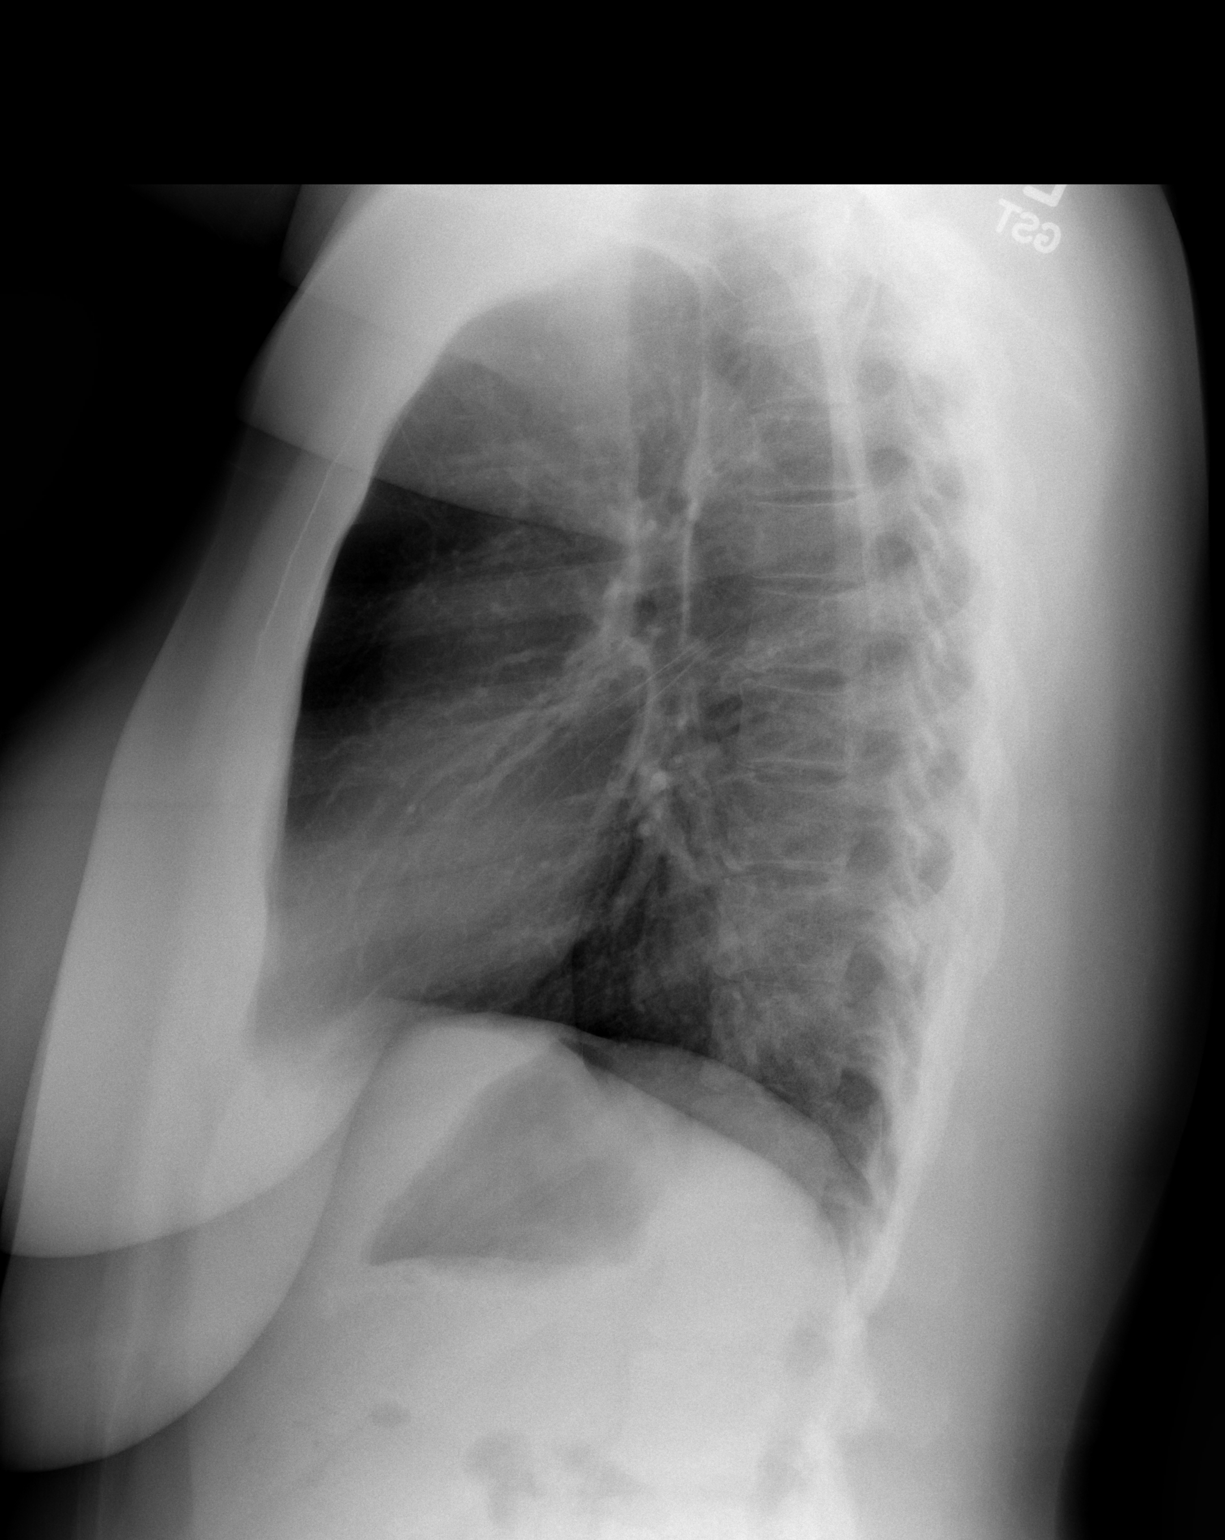

[2 of 2 positions shown; findings below may reference images not displayed]

FINDINGS: The heart size is normal.  The lungs are clear.  The
visualized soft tissues and bony thorax are unremarkable.
IMPRESSION: Negative chest.

## 2012-06-04 ENCOUNTER — Encounter (HOSPITAL_COMMUNITY): Payer: Self-pay | Admitting: *Deleted

## 2012-06-04 DIAGNOSIS — J45909 Unspecified asthma, uncomplicated: Secondary | ICD-10-CM | POA: Insufficient documentation

## 2012-06-04 DIAGNOSIS — M545 Low back pain, unspecified: Secondary | ICD-10-CM | POA: Insufficient documentation

## 2012-06-04 DIAGNOSIS — R109 Unspecified abdominal pain: Secondary | ICD-10-CM | POA: Insufficient documentation

## 2012-06-04 DIAGNOSIS — Z90710 Acquired absence of both cervix and uterus: Secondary | ICD-10-CM | POA: Insufficient documentation

## 2012-06-04 DIAGNOSIS — F17200 Nicotine dependence, unspecified, uncomplicated: Secondary | ICD-10-CM | POA: Insufficient documentation

## 2012-06-04 DIAGNOSIS — Z79899 Other long term (current) drug therapy: Secondary | ICD-10-CM | POA: Insufficient documentation

## 2012-06-04 DIAGNOSIS — F411 Generalized anxiety disorder: Secondary | ICD-10-CM | POA: Insufficient documentation

## 2012-06-04 DIAGNOSIS — R079 Chest pain, unspecified: Secondary | ICD-10-CM | POA: Insufficient documentation

## 2012-06-04 LAB — CBC WITH DIFFERENTIAL/PLATELET
Basophils Absolute: 0 10*3/uL (ref 0.0–0.1)
Basophils Relative: 0 % (ref 0–1)
Eosinophils Relative: 4 % (ref 0–5)
HCT: 38.7 % (ref 36.0–46.0)
Hemoglobin: 13.8 g/dL (ref 12.0–15.0)
MCH: 29.3 pg (ref 26.0–34.0)
MCHC: 35.7 g/dL (ref 30.0–36.0)
MCV: 82.2 fL (ref 78.0–100.0)
Monocytes Absolute: 0.5 10*3/uL (ref 0.1–1.0)
Monocytes Relative: 5 % (ref 3–12)
RDW: 13.3 % (ref 11.5–15.5)

## 2012-06-04 LAB — POCT I-STAT, CHEM 8
BUN: 10 mg/dL (ref 6–23)
Calcium, Ion: 1.31 mmol/L — ABNORMAL HIGH (ref 1.12–1.23)
Creatinine, Ser: 1 mg/dL (ref 0.50–1.10)
Hemoglobin: 13.6 g/dL (ref 12.0–15.0)
TCO2: 27 mmol/L (ref 0–100)

## 2012-06-04 NOTE — ED Notes (Signed)
~   1 yr. Ago, had kidney infection; lt. Flank pain. Feels like dehydrated x 2 weeks. Cp. Lt. Chest wall pain. Hurt when getting ready to go lay down.

## 2012-06-05 ENCOUNTER — Emergency Department (HOSPITAL_COMMUNITY)
Admission: EM | Admit: 2012-06-05 | Discharge: 2012-06-05 | Disposition: A | Discharge status: Home / Self Care | Payer: Self-pay | Attending: Emergency Medicine | Admitting: Emergency Medicine

## 2012-06-05 DIAGNOSIS — M549 Dorsalgia, unspecified: Secondary | ICD-10-CM

## 2012-06-05 DIAGNOSIS — R079 Chest pain, unspecified: Secondary | ICD-10-CM

## 2012-06-05 LAB — URINALYSIS, ROUTINE W REFLEX MICROSCOPIC
Bilirubin Urine: NEGATIVE
Hgb urine dipstick: NEGATIVE
Ketones, ur: NEGATIVE mg/dL
Nitrite: NEGATIVE
Specific Gravity, Urine: 1.01 (ref 1.005–1.030)
Urobilinogen, UA: 0.2 mg/dL (ref 0.0–1.0)

## 2012-06-05 LAB — URINE MICROSCOPIC-ADD ON

## 2012-06-05 MED ORDER — TRAMADOL HCL 50 MG PO TABS: 50.0000 mg | ORAL_TABLET | Freq: Four times a day (QID) | ORAL | Status: DC | PRN

## 2012-06-05 MED ORDER — NAPROXEN 500 MG PO TABS: 500.0000 mg | ORAL_TABLET | Freq: Two times a day (BID) | ORAL | Status: DC

## 2012-06-05 MED ORDER — OXYCODONE-ACETAMINOPHEN 5-325 MG PO TABS
1.0000 | ORAL_TABLET | Freq: Once | ORAL | Status: AC
  Administered 2012-06-05: 1 via ORAL
  Filled 2012-06-05: qty 1

## 2012-06-05 NOTE — ED Notes (Signed)
Pt states understanding of discharge instruction 

## 2012-06-05 NOTE — ED Notes (Signed)
CP excerbated by drinking certain beverages, heavy smoking,and after laying down. Pain relived by nothing

## 2012-06-05 NOTE — ED Provider Notes (Signed)
History     CSN: [id number]  Arrival date & time 06/04/12  2232   First MD Initiated Contact with Patient 06/05/12 0049      Chief Complaint  Patient presents with  . Flank Pain  . Abdominal Pain  . Chest Pain    (Consider location/radiation/quality/duration/timing/severity/associated sxs/prior treatment) HPI Comments: 27 year old female with a history of asthma and anxiety who presents with approximately one year of left lower back pain, left-sided pain which is constant, never seems to go away and is worse with movements. She also admits to having mild dysuria occasionally. She denies fevers chills nausea or vomiting. Nothing seems to improve this pain including over-the-counter medications like ibuprofen. It does not seem to be getting particularly worse.  The patient also complains of left-sided chest pain which she feels was she smokes or when she takes deep breaths and this has been going on for approximately 2 weeks. She continues to smoke cigarettes, had a urinary infection last year but never got her medications filled stating that she didn't have the money for it. The reason that she came this evening was much her child was brought to the emergency department to be treated for an infection.  Patient is a 27 y.o. female presenting with flank pain, abdominal pain, and chest pain. The history is provided by the patient.  Flank Pain This is a new problem. Associated symptoms include chest pain and abdominal pain.  Abdominal Pain The primary symptoms of the illness include abdominal pain.  Chest Pain Primary symptoms include abdominal pain.     Past Medical History  Diagnosis Date  . Asthma   . Anxiety     Past Surgical History  Procedure Date  . Abdominal hysterectomy     No family history on file.  History  Substance Use Topics  . Smoking status: Current Every Day Smoker -- 0.5 packs/day  . Smokeless tobacco: Not on file  . Alcohol Use: No    OB History    Grav Para Term Preterm Abortions TAB SAB Ect Mult Living                  Review of Systems  Cardiovascular: Positive for chest pain.  Gastrointestinal: Positive for abdominal pain.  Genitourinary: Positive for flank pain.  All other systems reviewed and are negative.    Allergies  Review of patient's allergies indicates no known allergies.  Home Medications   Current Outpatient Rx  Name  Route  Sig  Dispense  Refill  . IBUPROFEN 200 MG PO TABS   Oral   Take 800 mg by mouth every 6 (six) hours as needed. For pain         . NAPROXEN 500 MG PO TABS   Oral   Take 1 tablet (500 mg total) by mouth 2 (two) times daily with a meal.   30 tablet   0   . TRAMADOL HCL 50 MG PO TABS   Oral   Take 1 tablet (50 mg total) by mouth every 6 (six) hours as needed for pain.   15 tablet   0     BP 130/81  Pulse 66  Temp 97.4 F (36.3 C) (Oral)  Resp 20  SpO2 100%  Physical Exam  Nursing note and vitals reviewed. Constitutional: She appears well-developed and well-nourished. No distress.  HENT:  Head: Normocephalic and atraumatic.  Mouth/Throat: Oropharynx is clear and moist. No oropharyngeal exudate.  Eyes: Conjunctivae normal and EOM are normal. Pupils are equal,  round, and reactive to light. Right eye exhibits no discharge. Left eye exhibits no discharge. No scleral icterus.  Neck: Normal range of motion. Neck supple. No JVD present. No thyromegaly present.  Cardiovascular: Normal rate, regular rhythm, normal heart sounds and intact distal pulses.  Exam reveals no gallop and no friction rub.   No murmur heard. Pulmonary/Chest: Effort normal and breath sounds normal. No respiratory distress. She has no wheezes. She has no rales. She exhibits no tenderness.  Abdominal: Soft. Bowel sounds are normal. She exhibits no distension and no mass. There is no tenderness.  Musculoskeletal: Normal range of motion. She exhibits tenderness ( Mild tenderness to left lower back and the left  side). She exhibits no edema.  Lymphadenopathy:    She has no cervical adenopathy.  Neurological: She is alert. Coordination normal.  Skin: Skin is warm and dry. No rash noted. No erythema.  Psychiatric: She has a normal mood and affect. Her behavior is normal.    ED Course  Procedures (including critical care time)  Labs Reviewed  URINALYSIS, ROUTINE W REFLEX MICROSCOPIC - Abnormal; Notable for the following:    Leukocytes, UA TRACE (*)     All other components within normal limits  POCT I-STAT, CHEM 8 - Abnormal; Notable for the following:    Calcium, Ion 1.31 (*)     All other components within normal limits  URINE MICROSCOPIC-ADD ON - Abnormal; Notable for the following:    Squamous Epithelial / LPF FEW (*)     All other components within normal limits  CBC WITH DIFFERENTIAL  POCT I-STAT TROPONIN I   No results found.   1. Back pain   2. Chest pain       MDM  The patient has reproducible tenderness to her lower back, has an EKG which is nonischemic and non-concerning and labwork which is unremarkable. Urinalysis pending, troponin, CBC and chemistry within normal limits essentially. Patient will be given pain medication, doubt pyelonephritis, doubt significant cardiac pathology including ischemia or pulmonary embolism. I have counseled the patient on stopping smoking.  ED ECG REPORT  I personally interpreted this EKG   Date: 06/05/2012   Rate: 58  Rhythm: sinus bradycardia  QRS Axis: normal  Intervals: normal  ST/T Wave abnormalities: normal  Conduction Disutrbances:none  Narrative Interpretation:   Old EKG Reviewed: none available   Labs normal - UA - clean,   Labs negative including troponin.  Vida Roller, MD 06/05/12 0130

## 2012-06-05 NOTE — ED Notes (Signed)
Denies blood in her urine

## 2012-09-09 ENCOUNTER — Ambulatory Visit: Payer: Self-pay | Admitting: Obstetrics & Gynecology

## 2012-12-09 ENCOUNTER — Ambulatory Visit: Payer: Self-pay | Admitting: Obstetrics and Gynecology

## 2012-12-16 ENCOUNTER — Emergency Department (HOSPITAL_COMMUNITY)
Admission: EM | Admit: 2012-12-16 | Discharge: 2012-12-16 | Disposition: A | Discharge status: Home / Self Care | Payer: Medicaid Other | Attending: Emergency Medicine | Admitting: Emergency Medicine

## 2012-12-16 ENCOUNTER — Encounter (HOSPITAL_COMMUNITY): Payer: Self-pay | Admitting: *Deleted

## 2012-12-16 DIAGNOSIS — T148 Other injury of unspecified body region: Secondary | ICD-10-CM | POA: Insufficient documentation

## 2012-12-16 DIAGNOSIS — J45909 Unspecified asthma, uncomplicated: Secondary | ICD-10-CM | POA: Insufficient documentation

## 2012-12-16 DIAGNOSIS — Y939 Activity, unspecified: Secondary | ICD-10-CM | POA: Insufficient documentation

## 2012-12-16 DIAGNOSIS — F17200 Nicotine dependence, unspecified, uncomplicated: Secondary | ICD-10-CM | POA: Insufficient documentation

## 2012-12-16 DIAGNOSIS — Z79899 Other long term (current) drug therapy: Secondary | ICD-10-CM | POA: Insufficient documentation

## 2012-12-16 DIAGNOSIS — W57XXXA Bitten or stung by nonvenomous insect and other nonvenomous arthropods, initial encounter: Secondary | ICD-10-CM | POA: Insufficient documentation

## 2012-12-16 DIAGNOSIS — Y92009 Unspecified place in unspecified non-institutional (private) residence as the place of occurrence of the external cause: Secondary | ICD-10-CM | POA: Insufficient documentation

## 2012-12-16 DIAGNOSIS — Z8659 Personal history of other mental and behavioral disorders: Secondary | ICD-10-CM | POA: Insufficient documentation

## 2012-12-16 MED ORDER — HYDROCORTISONE 2.5 % EX CREA: TOPICAL_CREAM | CUTANEOUS | Status: DC

## 2012-12-16 MED ORDER — HYDROXYZINE HCL 25 MG PO TABS: 25.0000 mg | ORAL_TABLET | Freq: Four times a day (QID) | ORAL | Status: DC | PRN

## 2012-12-16 MED ORDER — ALBUTEROL SULFATE HFA 108 (90 BASE) MCG/ACT IN AERS
2.0000 | INHALATION_SPRAY | RESPIRATORY_TRACT | Status: DC | PRN
  Filled 2012-12-16: qty 6.7

## 2012-12-16 NOTE — ED Provider Notes (Signed)
  CSN: [id number]     Arrival date & time 12/16/12  4098 History     First MD Initiated Contact with Patient 12/16/12 [phone]     Chief Complaint  Patient presents with  . Insect Bites   (Consider location/radiation/quality/duration/timing/severity/associated sxs/prior Treatment) HPI This is a 27 year old female who states that her house is infested with fleas and she has been bitten multiple times over the past several days. The bites are primarily on her feet and lower legs. They are intensely pruritic as well as painful. She has tried topical Benadryl and calamine lotion without relief. She states this is exacerbated her asthma and she has been using a friend's inhaler as she no longer has one. She is not presently short of breath. The itching is so severe that she is unable to sleep past 2 nights.  Past Medical History  Diagnosis Date  . Asthma   . Anxiety    Past Surgical History  Procedure Laterality Date  . Abdominal hysterectomy     History reviewed. No pertinent family history. History  Substance Use Topics  . Smoking status: Current Every Day Smoker -- 0.50 packs/day  . Smokeless tobacco: Not on file  . Alcohol Use: No   OB History   Grav Para Term Preterm Abortions TAB SAB Ect Mult Living                 Review of Systems  All other systems reviewed and are negative.    Allergies  Review of patient's allergies indicates no known allergies.  Home Medications   Current Outpatient Rx  Name  Route  Sig  Dispense  Refill  . ibuprofen (ADVIL,MOTRIN) 200 MG tablet   Oral   Take 800 mg by mouth every 6 (six) hours as needed. For pain         . naproxen (NAPROSYN) 500 MG tablet   Oral   Take 1 tablet (500 mg total) by mouth 2 (two) times daily with a meal.   30 tablet   0   . traMADol (ULTRAM) 50 MG tablet   Oral   Take 1 tablet (50 mg total) by mouth every 6 (six) hours as needed for pain.   15 tablet   0    There were no vitals taken for this  visit.  Physical Exam General: Well-developed, well-nourished female in no acute distress; appearance consistent with age of record HENT: normocephalic, atraumatic Eyes: pupils equal round and reactive to light; extraocular muscles intact Neck: supple Heart: regular rate and rhythm Lungs: clear to auscultation bilaterally Abdomen: soft; nondistended Extremities: No deformity; full range of motion Neurologic: Awake, alert and oriented; motor function intact in all extremities and symmetric; no facial droop Skin: Multiple mildly erythematous papules on the feet and ankles each about 1/2 cm in diameter Psychiatric: Normal mood and affect    ED Course   Procedures (including critical care time)   MDM  We will treat with hydroxyzine and provided her an inhaler.  Hanley Seamen, MD 12/16/12 [phone]

## 2012-12-16 NOTE — ED Notes (Signed)
Pt presents to the ED with multiple insect bits on bilateral legs and arms. Pt thinks the bites are from fleas

## 2012-12-16 NOTE — ED Notes (Signed)
MD at bedside. 

## 2013-02-13 ENCOUNTER — Encounter (HOSPITAL_COMMUNITY): Payer: Self-pay | Admitting: *Deleted

## 2013-02-13 ENCOUNTER — Inpatient Hospital Stay (HOSPITAL_COMMUNITY)
Admission: AD | Admit: 2013-02-13 | Discharge: 2013-02-14 | Disposition: A | Discharge status: Home / Self Care | Payer: Medicaid Other | Source: Ambulatory Visit | Attending: Obstetrics & Gynecology | Admitting: Obstetrics & Gynecology

## 2013-02-13 ENCOUNTER — Inpatient Hospital Stay (HOSPITAL_COMMUNITY): Payer: Medicaid Other

## 2013-02-13 DIAGNOSIS — N949 Unspecified condition associated with female genital organs and menstrual cycle: Secondary | ICD-10-CM | POA: Insufficient documentation

## 2013-02-13 DIAGNOSIS — R102 Pelvic and perineal pain: Secondary | ICD-10-CM

## 2013-02-13 DIAGNOSIS — Z90710 Acquired absence of both cervix and uterus: Secondary | ICD-10-CM | POA: Insufficient documentation

## 2013-02-13 HISTORY — DX: Herpesviral infection, unspecified: B00.9

## 2013-02-13 LAB — WET PREP, GENITAL
Trich, Wet Prep: NONE SEEN
Yeast Wet Prep HPF POC: NONE SEEN

## 2013-02-13 MED ORDER — KETOROLAC TROMETHAMINE 60 MG/2ML IM SOLN
60.0000 mg | Freq: Once | INTRAMUSCULAR | Status: AC
  Administered 2013-02-13: 60 mg via INTRAMUSCULAR
  Filled 2013-02-13: qty 2

## 2013-02-13 NOTE — MAU Provider Note (Signed)
History     CSN: [id number]  Arrival date and time: 02/13/13 2053   First Provider Initiated Contact with Patient 02/13/13 2214      Chief Complaint  Patient presents with  . Abdominal Pain   Abdominal Pain    Janice Pena is a 27 y.o. G2P2 who present today with left ovarian pain. She rates the pain 10/10, and has tried ibuprofen, but it does not help. She states that she had a hysterectomy for abnormal bleeding by Dr. Despina Hidden. She called [facility] to try to schedule an appointment, but she lives in [place] now and does not have a ride. She states that she has had the pain off and on for about 6 months now.   Past Medical History  Diagnosis Date  . Asthma   . Anxiety   . HSV infection     Past Surgical History  Procedure Laterality Date  . Abdominal hysterectomy      History reviewed. No pertinent family history.  History  Substance Use Topics  . Smoking status: Current Every Day Smoker -- 0.50 packs/day    Types: Cigarettes  . Smokeless tobacco: Not on file  . Alcohol Use: No    Allergies: No Known Allergies  No prescriptions prior to admission    Review of Systems  Gastrointestinal: Positive for abdominal pain.   Physical Exam   Blood pressure 133/82, pulse 72, temperature 98 F (36.7 C), temperature source Oral, resp. rate 18, height 6\' 5"  (1.956 m), weight 77.293 kg (170 lb 6.4 oz).  Physical Exam  Nursing note and vitals reviewed. Constitutional: She is oriented to person, place, and time. She appears well-developed and well-nourished. No distress.  Cardiovascular: Normal rate.   Respiratory: Effort normal.  GI: Soft. There is no tenderness.  Genitourinary:   External: no lesion Vagina: small amount of white discharge Cervix: SA Uterus: SA Adnexa: Left: tender, right: non tender.    Neurological: She is alert and oriented to person, place, and time.  Skin: Skin is warm and dry.  Psychiatric: She has a normal mood and affect.    MAU Course   Procedures  Results for orders placed during the hospital encounter of 02/13/13 (from the past 24 hour(s))  WET PREP, GENITAL     Status: Abnormal   Collection Time    02/13/13 10:15 PM      Result Value Range   Yeast Wet Prep HPF POC NONE SEEN  NONE SEEN   Trich, Wet Prep NONE SEEN  NONE SEEN   Clue Cells Wet Prep HPF POC FEW (*) NONE SEEN   WBC, Wet Prep HPF POC FEW (*) NONE SEEN    US Transvaginal Non-ob  02/13/2013   CLINICAL DATA:  Pelvic pain  EXAM: TRANSABDOMINAL AND TRANSVAGINAL ULTRASOUND OF PELVIS  TECHNIQUE: Both transabdominal and transvaginal ultrasound examinations of the pelvis were performed. Transabdominal technique was performed for global imaging of the pelvis including uterus, ovaries, adnexal regions, and pelvic cul-de-sac. It was necessary to proceed with endovaginal exam following the transabdominal exam to visualize the ovaries and adnexa.  COMPARISON:  None  FINDINGS: Uterus  Measurements: Prior hysterectomy. No fibroids or other mass visualized.  Endometrium  Thickness: N/A  No focal abnormality visualized.  Right ovary  Measurements: 4.5 x 2.3 x 3.2 cm. Normal appearance/no adnexal mass.  Left ovary  Measurements: 3.4 x 2.7 x 2.4 cm. Normal appearance/no adnexal mass.  Other findings  No free fluid.  IMPRESSION: Prior hysterectomy.  Negative exam.  Electronically Signed   By: Charlett Nose M.D.   On: 02/13/2013 23:29   US Pelvis Complete  02/13/2013   CLINICAL DATA:  Pelvic pain  EXAM: TRANSABDOMINAL AND TRANSVAGINAL ULTRASOUND OF PELVIS  TECHNIQUE: Both transabdominal and transvaginal ultrasound examinations of the pelvis were performed. Transabdominal technique was performed for global imaging of the pelvis including uterus, ovaries, adnexal regions, and pelvic cul-de-sac. It was necessary to proceed with endovaginal exam following the transabdominal exam to visualize the ovaries and adnexa.  COMPARISON:  None  FINDINGS: Uterus  Measurements: Prior hysterectomy. No  fibroids or other mass visualized.  Endometrium  Thickness: N/A  No focal abnormality visualized.  Right ovary  Measurements: 4.5 x 2.3 x 3.2 cm. Normal appearance/no adnexal mass.  Left ovary  Measurements: 3.4 x 2.7 x 2.4 cm. Normal appearance/no adnexal mass.  Other findings  No free fluid.  IMPRESSION: Prior hysterectomy.  Negative exam.   Electronically Signed   By: Charlett Nose M.D.   On: 02/13/2013 23:29     Assessment and Plan   1. Pelvic pain in female    Comfort measures reviewed FU with the clinic Return to MAU as needed   Janice Pena 02/13/2013, 10:25 PM

## 2013-02-13 NOTE — MAU Note (Signed)
Pt states her "ovaries" hurt on the left side intermittently for the last 6 months.  Pt states her husband currently has an outbreak of HSV and needs to be treated.  Last intercourse unprotected was this morning.

## 2013-02-14 MED ORDER — ACYCLOVIR 400 MG PO TABS: 400.0000 mg | ORAL_TABLET | Freq: Three times a day (TID) | ORAL | Status: DC

## 2013-02-14 NOTE — MAU Provider Note (Signed)
Attestation of Attending Supervision of Advanced Practitioner (CNM/NP): Evaluation and management procedures were performed by the Advanced Practitioner under my supervision and collaboration.  I have reviewed the Advanced Practitioner's note and chart, and I agree with the management and plan.  HARRAWAY-SMITH, Chrystie Hagwood 2:00 AM

## 2013-02-27 ENCOUNTER — Encounter: Payer: Medicaid Other | Admitting: Obstetrics & Gynecology

## 2013-04-07 ENCOUNTER — Emergency Department (HOSPITAL_COMMUNITY)
Admission: EM | Admit: 2013-04-07 | Discharge: 2013-04-07 | Disposition: A | Discharge status: Home / Self Care | Payer: Medicaid Other | Attending: Emergency Medicine | Admitting: Emergency Medicine

## 2013-04-07 ENCOUNTER — Encounter (HOSPITAL_COMMUNITY): Payer: Self-pay | Admitting: Emergency Medicine

## 2013-04-07 DIAGNOSIS — Z79899 Other long term (current) drug therapy: Secondary | ICD-10-CM | POA: Insufficient documentation

## 2013-04-07 DIAGNOSIS — R110 Nausea: Secondary | ICD-10-CM | POA: Insufficient documentation

## 2013-04-07 DIAGNOSIS — Z8659 Personal history of other mental and behavioral disorders: Secondary | ICD-10-CM | POA: Insufficient documentation

## 2013-04-07 DIAGNOSIS — F17200 Nicotine dependence, unspecified, uncomplicated: Secondary | ICD-10-CM | POA: Insufficient documentation

## 2013-04-07 DIAGNOSIS — N390 Urinary tract infection, site not specified: Secondary | ICD-10-CM | POA: Insufficient documentation

## 2013-04-07 DIAGNOSIS — J45909 Unspecified asthma, uncomplicated: Secondary | ICD-10-CM | POA: Insufficient documentation

## 2013-04-07 DIAGNOSIS — Z8619 Personal history of other infectious and parasitic diseases: Secondary | ICD-10-CM | POA: Insufficient documentation

## 2013-04-07 LAB — URINALYSIS, ROUTINE W REFLEX MICROSCOPIC
Glucose, UA: NEGATIVE mg/dL
Protein, ur: NEGATIVE mg/dL
Urobilinogen, UA: 0.2 mg/dL (ref 0.0–1.0)

## 2013-04-07 LAB — CBC WITH DIFFERENTIAL/PLATELET
Basophils Absolute: 0 10*3/uL (ref 0.0–0.1)
Basophils Relative: 0 % (ref 0–1)
Eosinophils Absolute: 0.2 10*3/uL (ref 0.0–0.7)
Eosinophils Relative: 3 % (ref 0–5)
HCT: 39.2 % (ref 36.0–46.0)
Lymphocytes Relative: 34 % (ref 12–46)
MCH: 27.8 pg (ref 26.0–34.0)
MCHC: 34.7 g/dL (ref 30.0–36.0)
MCV: 80.2 fL (ref 78.0–100.0)
Monocytes Absolute: 0.3 10*3/uL (ref 0.1–1.0)
RDW: 13.8 % (ref 11.5–15.5)

## 2013-04-07 LAB — COMPREHENSIVE METABOLIC PANEL
AST: 16 U/L (ref 0–37)
CO2: 24 mEq/L (ref 19–32)
Calcium: 9.5 mg/dL (ref 8.4–10.5)
Creatinine, Ser: 0.87 mg/dL (ref 0.50–1.10)
GFR calc non Af Amer: 89 mL/min — ABNORMAL LOW (ref >90)

## 2013-04-07 LAB — URINE MICROSCOPIC-ADD ON

## 2013-04-07 MED ORDER — SODIUM CHLORIDE 0.9 % IV BOLUS (SEPSIS)
1000.0000 mL | Freq: Once | INTRAVENOUS | Status: AC
  Administered 2013-04-07: 1000 mL via INTRAVENOUS

## 2013-04-07 MED ORDER — ONDANSETRON HCL 4 MG/2ML IJ SOLN
4.0000 mg | Freq: Once | INTRAMUSCULAR | Status: AC
  Administered 2013-04-07: 4 mg via INTRAVENOUS
  Filled 2013-04-07: qty 2

## 2013-04-07 MED ORDER — TRAMADOL HCL 50 MG PO TABS: 50.0000 mg | ORAL_TABLET | Freq: Four times a day (QID) | ORAL | Status: DC | PRN

## 2013-04-07 MED ORDER — MORPHINE SULFATE 4 MG/ML IJ SOLN
4.0000 mg | Freq: Once | INTRAMUSCULAR | Status: AC
  Administered 2013-04-07: 4 mg via INTRAVENOUS
  Filled 2013-04-07: qty 1

## 2013-04-07 MED ORDER — SULFAMETHOXAZOLE-TRIMETHOPRIM 800-160 MG PO TABS: 1.0000 | ORAL_TABLET | Freq: Two times a day (BID) | ORAL | Status: DC

## 2013-04-07 NOTE — ED Provider Notes (Signed)
CSN: [id number]     Arrival date & time 04/07/13  0957 History   First MD Initiated Contact with Patient 04/07/13 1119     Chief Complaint  Patient presents with  . Abdominal Pain   (Consider location/radiation/quality/duration/timing/severity/associated sxs/prior Treatment) HPI Comments: Patient is a 27 year old female with a past medical history of partial abdominal hysterectomy with ovaries remaining, HSV, and anxiety who presents with a 1 year history of abdominal pain. The pain has become acutely worse in the past week. The pain is aching and severe with occasional radiation to her right lower abdomen. Patient reports associated occasional nausea. Patient has tried ibuprofen for pain without relief. No aggravating/alleviating factors. Patient reports having bowel movements about twice per week which has always been normal for her. Patient reports her symptoms do not changes after bowel movements. No other associated symptoms.    Past Medical History  Diagnosis Date  . Asthma   . Anxiety   . HSV infection    Past Surgical History  Procedure Laterality Date  . Abdominal hysterectomy     No family history on file. History  Substance Use Topics  . Smoking status: Current Every Day Smoker -- 0.50 packs/day    Types: Cigarettes  . Smokeless tobacco: Not on file  . Alcohol Use: No   OB History   Grav Para Term Preterm Abortions TAB SAB Ect Mult Living   2 2        2      Review of Systems  Gastrointestinal: Positive for nausea and abdominal pain.  All other systems reviewed and are negative.    Allergies  Review of patient's allergies indicates no known allergies.  Home Medications   Current Outpatient Rx  Name  Route  Sig  Dispense  Refill  . acyclovir (ZOVIRAX) 400 MG tablet   Oral   Take 1 tablet (400 mg total) by mouth 3 (three) times daily.   28 tablet   0    BP 137/98  Pulse 64  Temp(Src) 98.2 F (36.8 C)  Resp 18  SpO2 100% Physical Exam  Nursing note  and vitals reviewed. Constitutional: She is oriented to person, place, and time. She appears well-developed and well-nourished. No distress.  HENT:  Head: Normocephalic and atraumatic.  Eyes: Conjunctivae and EOM are normal.  Neck: Normal range of motion.  Cardiovascular: Normal rate and regular rhythm.  Exam reveals no gallop and no friction rub.   No murmur heard. Pulmonary/Chest: Effort normal and breath sounds normal. She has no wheezes. She has no rales. She exhibits no tenderness.  Abdominal: Soft. She exhibits no distension. There is tenderness. There is no rebound and no guarding.  Left lower abdominal tenderness to palpation. No other focal tenderness or peritoneal signs.   Musculoskeletal: Normal range of motion.  Neurological: She is alert and oriented to person, place, and time. Coordination normal.  Speech is goal-oriented. Moves limbs without ataxia.   Skin: Skin is warm and dry.  Psychiatric: She has a normal mood and affect. Her behavior is normal.    ED Course  Procedures (including critical care time) Labs Review Labs Reviewed  COMPREHENSIVE METABOLIC PANEL - Abnormal; Notable for the following:    GFR calc non Af Amer 89 (*)    All other components within normal limits  URINALYSIS, ROUTINE W REFLEX MICROSCOPIC - Abnormal; Notable for the following:    APPearance CLOUDY (*)    Hgb urine dipstick SMALL (*)    Nitrite POSITIVE (*)  Leukocytes, UA SMALL (*)    All other components within normal limits  URINE MICROSCOPIC-ADD ON - Abnormal; Notable for the following:    Bacteria, UA MANY (*)    All other components within normal limits  CBC WITH DIFFERENTIAL  LIPASE, BLOOD   Imaging Review No results found.  EKG Interpretation   None       MDM   1. UTI (urinary tract infection)     12:01 PM Labs unremarkable. Patient will have morphine and zofran for symptoms. Urinalysis pending. Vitals stable and patient afebrile.   2:27 PM Patient feeling  better. Patient has UTI based on urinalysis. I will treat her with bactrim. Vitals stable and patient afebrile. Patient will be referred to GI.   Emilia Beck, PA-C 04/07/13 1440

## 2013-04-07 NOTE — ED Notes (Signed)
Patient unable to give a urine sample at this time. She states she is a little dehydrated.

## 2013-04-07 NOTE — ED Notes (Signed)
Pt discharged.Vital signs stable and GCS 15.Discharge instruction given. 

## 2013-04-07 NOTE — ED Provider Notes (Signed)
Medical screening examination/treatment/procedure(s) were performed by non-physician practitioner and as supervising physician I was immediately available for consultation/collaboration.    Nelia Shi, MD 04/07/13 [phone]

## 2013-04-07 NOTE — ED Notes (Signed)
Patient states she is hungry and would like something to eat and drink.

## 2013-04-07 NOTE — ED Notes (Signed)
Patient unable to void at this time

## 2013-04-07 NOTE — ED Notes (Addendum)
abd pain x 1 year and it has gotten worse over the  Month states has a smell down there and it hurts to void pain is crampy states she has had partial hyst

## 2013-05-09 ENCOUNTER — Emergency Department (HOSPITAL_COMMUNITY)
Admission: EM | Admit: 2013-05-09 | Discharge: 2013-05-10 | Disposition: A | Discharge status: Home / Self Care | Payer: Medicaid Other | Attending: Emergency Medicine | Admitting: Emergency Medicine

## 2013-05-09 ENCOUNTER — Encounter (HOSPITAL_COMMUNITY): Payer: Self-pay | Admitting: Emergency Medicine

## 2013-05-09 DIAGNOSIS — IMO0002 Reserved for concepts with insufficient information to code with codable children: Secondary | ICD-10-CM | POA: Insufficient documentation

## 2013-05-09 DIAGNOSIS — Z8619 Personal history of other infectious and parasitic diseases: Secondary | ICD-10-CM | POA: Insufficient documentation

## 2013-05-09 DIAGNOSIS — J45901 Unspecified asthma with (acute) exacerbation: Secondary | ICD-10-CM

## 2013-05-09 DIAGNOSIS — J029 Acute pharyngitis, unspecified: Secondary | ICD-10-CM | POA: Insufficient documentation

## 2013-05-09 DIAGNOSIS — F17200 Nicotine dependence, unspecified, uncomplicated: Secondary | ICD-10-CM | POA: Insufficient documentation

## 2013-05-09 DIAGNOSIS — F411 Generalized anxiety disorder: Secondary | ICD-10-CM | POA: Insufficient documentation

## 2013-05-09 DIAGNOSIS — R1310 Dysphagia, unspecified: Secondary | ICD-10-CM | POA: Insufficient documentation

## 2013-05-09 DIAGNOSIS — Z79899 Other long term (current) drug therapy: Secondary | ICD-10-CM | POA: Insufficient documentation

## 2013-05-09 MED ORDER — ALBUTEROL SULFATE (5 MG/ML) 0.5% IN NEBU
2.5000 mg | INHALATION_SOLUTION | Freq: Once | RESPIRATORY_TRACT | Status: AC
  Administered 2013-05-09: 2.5 mg via RESPIRATORY_TRACT
  Filled 2013-05-09: qty 0.5

## 2013-05-09 MED ORDER — IPRATROPIUM BROMIDE 0.02 % IN SOLN
0.5000 mg | Freq: Once | RESPIRATORY_TRACT | Status: AC
  Administered 2013-05-09: 0.5 mg via RESPIRATORY_TRACT
  Filled 2013-05-09: qty 2.5

## 2013-05-09 MED ORDER — PREDNISONE 20 MG PO TABS
60.0000 mg | ORAL_TABLET | Freq: Once | ORAL | Status: AC
  Administered 2013-05-09: 60 mg via ORAL
  Filled 2013-05-09: qty 3

## 2013-05-09 MED ORDER — PREDNISONE 20 MG PO TABS: 40.0000 mg | ORAL_TABLET | Freq: Every day | ORAL | Status: DC

## 2013-05-09 NOTE — ED Provider Notes (Signed)
CSN: [id number]     Arrival date & time 05/09/13  2014 History  This chart was scribed for Earley Favor, NP, working with Enid Skeens, MD by Blanchard Kelch, ED Scribe. This patient was seen in room WTR8/WTR8 and the patient's care was started at 11:33 PM.     Chief Complaint  Patient presents with  . Chest Pain    Patient is a 27 y.o. female presenting with chest pain. The history is provided by the patient. No language interpreter was used.  Chest Pain Pain location:  Unable to specify Pain quality: tightness   Pain severity:  Moderate Onset quality:  Gradual Duration:  3 weeks Timing:  Constant Progression:  Unchanged Chronicity:  Recurrent Context: breathing   Ineffective treatments: Albuterol and Advair. Associated symptoms: dysphagia   Associated symptoms: no fever     HPI Comments: Janice Pena is a 27 y.o. female with a history of asthma who presents to the Emergency Department complaining of chest tightness for three weeks. She was seen by her Pulmonologist who prescribed Albuerol and Advair. She has been using them twice a day without relief of symptoms. She denies that she was given an oral steroid regiment. She reports that her throat has been sore and it has been hard to swallow intermittently since she started using her inhaler. She denies fever.    Past Medical History  Diagnosis Date  . Asthma   . Anxiety   . HSV infection    Past Surgical History  Procedure Laterality Date  . Abdominal hysterectomy     History reviewed. No pertinent family history. History  Substance Use Topics  . Smoking status: Current Every Day Smoker -- 0.50 packs/day    Types: Cigarettes  . Smokeless tobacco: Not on file  . Alcohol Use: No   OB History   Grav Para Term Preterm Abortions TAB SAB Ect Mult Living   2 2        2      Review of Systems  Constitutional: Negative for fever.  HENT: Positive for sore throat and trouble swallowing.   Respiratory: Positive for  chest tightness.   Cardiovascular: Positive for chest pain.  All other systems reviewed and are negative.    Allergies  Lactose intolerance (gi) and Oxycontin  Home Medications   Current Outpatient Rx  Name  Route  Sig  Dispense  Refill  . acyclovir (ZOVIRAX) 400 MG tablet   Oral   Take 1 tablet (400 mg total) by mouth 3 (three) times daily.   28 tablet   0   . albuterol (PROVENTIL HFA;VENTOLIN HFA) 108 (90 BASE) MCG/ACT inhaler   Inhalation   Inhale 1-2 puffs into the lungs every 6 (six) hours as needed for wheezing or shortness of breath (shortness of breath).         Marland Kitchen ibuprofen (ADVIL,MOTRIN) 200 MG tablet   Oral   Take 600 mg by mouth every 6 (six) hours as needed (pain).          . LORazepam (ATIVAN) 1 MG tablet   Oral   Take 1 mg by mouth every 4 (four) hours as needed for anxiety (anxiety).         . predniSONE (DELTASONE) 20 MG tablet   Oral   Take 2 tablets (40 mg total) by mouth daily with breakfast.   10 tablet   0    Triage Vitals: BP 134/73  Pulse 64  Temp(Src) 98.4 F (36.9 C) (Oral)  Ht  5\' 4"  (1.626 m)  Wt 163 lb (73.936 kg)  BMI 27.97 kg/m2  SpO2 100%  Physical Exam  Nursing note and vitals reviewed. Constitutional: She is oriented to person, place, and time. She appears well-developed and well-nourished. No distress.  HENT:  Head: Normocephalic and atraumatic.  Eyes: EOM are normal.  Neck: Neck supple. No tracheal deviation present.  Cardiovascular: Normal rate.   Pulmonary/Chest: Effort normal. No respiratory distress.  Musculoskeletal: Normal range of motion.  Neurological: She is alert and oriented to person, place, and time.  Skin: Skin is warm and dry.  Psychiatric: She has a normal mood and affect. Her behavior is normal.    ED Course  Procedures (including critical care time)  DIAGNOSTIC STUDIES: Oxygen Saturation is 100% on room air, normal by my interpretation.    COORDINATION OF CARE: 11:34 PM -Will order breathing  treatment and place patient on oral steroids. Patient verbalizes understanding and agrees with treatment plan.    Labs Review Labs Reviewed - No data to display Imaging Review No results found.  EKG Interpretation   None       MDM   1. Asthma exacerbation      I personally performed the services described in this documentation, which was scribed in my presence. The recorded information has been reviewed and is accurate.     Arman Filter, NP 05/10/13 Jacinta Shoe

## 2013-05-09 NOTE — ED Notes (Signed)
[facility] called for neb tx.

## 2013-05-09 NOTE — ED Notes (Signed)
Patient is alert and oriented x4.  She is complaining of chest tightness and pain that has been going on for three days.  She does have a history of asthma.

## 2013-05-12 NOTE — ED Provider Notes (Signed)
Medical screening examination/treatment/procedure(s) were performed by non-physician practitioner and as supervising physician I was immediately available for consultation/collaboration.  EKG Interpretation   None         Enid Skeens, MD 05/12/13 1147

## 2013-11-04 ENCOUNTER — Emergency Department (HOSPITAL_COMMUNITY): Payer: Medicaid Other

## 2013-11-04 ENCOUNTER — Emergency Department (HOSPITAL_COMMUNITY)
Admission: EM | Admit: 2013-11-04 | Discharge: 2013-11-04 | Disposition: A | Discharge status: Home / Self Care | Payer: Self-pay | Attending: Emergency Medicine | Admitting: Emergency Medicine

## 2013-11-04 ENCOUNTER — Encounter (HOSPITAL_COMMUNITY): Payer: Self-pay | Admitting: Emergency Medicine

## 2013-11-04 DIAGNOSIS — R112 Nausea with vomiting, unspecified: Secondary | ICD-10-CM | POA: Insufficient documentation

## 2013-11-04 DIAGNOSIS — F411 Generalized anxiety disorder: Secondary | ICD-10-CM | POA: Insufficient documentation

## 2013-11-04 DIAGNOSIS — A499 Bacterial infection, unspecified: Secondary | ICD-10-CM | POA: Insufficient documentation

## 2013-11-04 DIAGNOSIS — J45909 Unspecified asthma, uncomplicated: Secondary | ICD-10-CM | POA: Insufficient documentation

## 2013-11-04 DIAGNOSIS — R1032 Left lower quadrant pain: Secondary | ICD-10-CM | POA: Insufficient documentation

## 2013-11-04 DIAGNOSIS — B9689 Other specified bacterial agents as the cause of diseases classified elsewhere: Secondary | ICD-10-CM | POA: Insufficient documentation

## 2013-11-04 DIAGNOSIS — Z8619 Personal history of other infectious and parasitic diseases: Secondary | ICD-10-CM | POA: Insufficient documentation

## 2013-11-04 DIAGNOSIS — R5383 Other fatigue: Secondary | ICD-10-CM

## 2013-11-04 DIAGNOSIS — Z79899 Other long term (current) drug therapy: Secondary | ICD-10-CM | POA: Insufficient documentation

## 2013-11-04 DIAGNOSIS — Z90710 Acquired absence of both cervix and uterus: Secondary | ICD-10-CM | POA: Insufficient documentation

## 2013-11-04 DIAGNOSIS — R109 Unspecified abdominal pain: Secondary | ICD-10-CM | POA: Insufficient documentation

## 2013-11-04 DIAGNOSIS — Z3202 Encounter for pregnancy test, result negative: Secondary | ICD-10-CM | POA: Insufficient documentation

## 2013-11-04 DIAGNOSIS — F17200 Nicotine dependence, unspecified, uncomplicated: Secondary | ICD-10-CM | POA: Insufficient documentation

## 2013-11-04 DIAGNOSIS — R5381 Other malaise: Secondary | ICD-10-CM | POA: Insufficient documentation

## 2013-11-04 DIAGNOSIS — N760 Acute vaginitis: Secondary | ICD-10-CM | POA: Insufficient documentation

## 2013-11-04 LAB — URINE MICROSCOPIC-ADD ON

## 2013-11-04 LAB — CBC
HCT: 40.3 % (ref 36.0–46.0)
Hemoglobin: 14.2 g/dL (ref 12.0–15.0)
MCH: 28.3 pg (ref 26.0–34.0)
MCHC: 35.2 g/dL (ref 30.0–36.0)
MCV: 80.4 fL (ref 78.0–100.0)
Platelets: 305 10*3/uL (ref 150–400)
RBC: 5.01 MIL/uL (ref 3.87–5.11)
RDW: 13.4 % (ref 11.5–15.5)
WBC: 8.3 10*3/uL (ref 4.0–10.5)

## 2013-11-04 LAB — COMPREHENSIVE METABOLIC PANEL
ALT: 8 U/L (ref 0–35)
AST: 15 U/L (ref 0–37)
Albumin: 3.9 g/dL (ref 3.5–5.2)
Alkaline Phosphatase: 88 U/L (ref 39–117)
BILIRUBIN TOTAL: 0.3 mg/dL (ref 0.3–1.2)
BUN: 8 mg/dL (ref 6–23)
CALCIUM: 9.7 mg/dL (ref 8.4–10.5)
CHLORIDE: 105 meq/L (ref 96–112)
CO2: 23 meq/L (ref 19–32)
Creatinine, Ser: 0.76 mg/dL (ref 0.50–1.10)
GLUCOSE: 93 mg/dL (ref 70–99)
Potassium: 4 mEq/L (ref 3.7–5.3)
Sodium: 140 mEq/L (ref 137–147)
Total Protein: 7.6 g/dL (ref 6.0–8.3)

## 2013-11-04 LAB — URINALYSIS, ROUTINE W REFLEX MICROSCOPIC
Bilirubin Urine: NEGATIVE
Glucose, UA: NEGATIVE mg/dL
HGB URINE DIPSTICK: NEGATIVE
Ketones, ur: NEGATIVE mg/dL
NITRITE: NEGATIVE
PROTEIN: NEGATIVE mg/dL
SPECIFIC GRAVITY, URINE: 1.025 (ref 1.005–1.030)
UROBILINOGEN UA: 1 mg/dL (ref 0.0–1.0)
pH: 6.5 (ref 5.0–8.0)

## 2013-11-04 LAB — WET PREP, GENITAL
Trich, Wet Prep: NONE SEEN
YEAST WET PREP: NONE SEEN

## 2013-11-04 LAB — POC URINE PREG, ED: Preg Test, Ur: NEGATIVE

## 2013-11-04 MED ORDER — ONDANSETRON HCL 4 MG/2ML IJ SOLN
4.0000 mg | Freq: Once | INTRAMUSCULAR | Status: AC
  Administered 2013-11-04: 4 mg via INTRAVENOUS
  Filled 2013-11-04: qty 2

## 2013-11-04 MED ORDER — IOHEXOL 300 MG/ML  SOLN
100.0000 mL | Freq: Once | INTRAMUSCULAR | Status: AC | PRN
  Administered 2013-11-04: 100 mL via INTRAVENOUS

## 2013-11-04 MED ORDER — IOHEXOL 300 MG/ML  SOLN
25.0000 mL | Freq: Once | INTRAMUSCULAR | Status: AC | PRN
  Administered 2013-11-04: 25 mL via ORAL

## 2013-11-04 MED ORDER — KETOROLAC TROMETHAMINE 30 MG/ML IJ SOLN
30.0000 mg | Freq: Once | INTRAMUSCULAR | Status: AC
  Administered 2013-11-04: 30 mg via INTRAVENOUS
  Filled 2013-11-04: qty 1

## 2013-11-04 MED ORDER — METRONIDAZOLE 500 MG PO TABS: 500.0000 mg | ORAL_TABLET | Freq: Two times a day (BID) | ORAL | Status: DC

## 2013-11-04 NOTE — ED Notes (Signed)
Patient transported to CT 

## 2013-11-04 NOTE — ED Notes (Signed)
Pt finished drinking oral CT contrast. Made CT aware.

## 2013-11-04 NOTE — ED Notes (Signed)
Per EMS - pt c/o LLQ abd pain, tender to touch x 2 days. Hx of the same. Partial hysterectomy 5 years ago. Normally has a BM x 2 a week. Pt reports she has increased her water intake x 2 weeks, increased urine output. Denies dysuria. Pt sts she has had some nausea this morning, vomited x 3 times this morning. Denies diarrhea, LBM this morning, was regular. Nad, skin warm and dry, resp e/u.

## 2013-11-04 NOTE — Discharge Instructions (Signed)
Take antibiotic flagyl as prescribed for the next week for bacterial vaginosis.  Abdominal Pain, Women Abdominal (stomach, pelvic, or belly) pain can be caused by many things. It is important to tell your doctor:  The location of the pain.  Does it come and go or is it present all the time?  Are there things that start the pain (eating certain foods, exercise)?  Are there other symptoms associated with the pain (fever, nausea, vomiting, diarrhea)? All of this is helpful to know when trying to find the cause of the pain. CAUSES   Stomach: virus or bacteria infection, or ulcer.  Intestine: appendicitis (inflamed appendix), regional ileitis (Crohn's disease), ulcerative colitis (inflamed colon), irritable bowel syndrome, diverticulitis (inflamed diverticulum of the colon), or cancer of the stomach or intestine.  Gallbladder disease or stones in the gallbladder.  Kidney disease, kidney stones, or infection.  Pancreas infection or cancer.  Fibromyalgia (pain disorder).  Diseases of the female organs:  Uterus: fibroid (non-cancerous) tumors or infection.  Fallopian tubes: infection or tubal pregnancy.  Ovary: cysts or tumors.  Pelvic adhesions (scar tissue).  Endometriosis (uterus lining tissue growing in the pelvis and on the pelvic organs).  Pelvic congestion syndrome (female organs filling up with blood just before the menstrual period).  Pain with the menstrual period.  Pain with ovulation (producing an egg).  Pain with an IUD (intrauterine device, birth control) in the uterus.  Cancer of the female organs.  Functional pain (pain not caused by a disease, may improve without treatment).  Psychological pain.  Depression. DIAGNOSIS  Your doctor will decide the seriousness of your pain by doing an examination.  Blood tests.  X-rays.  Ultrasound.  CT scan (computed tomography, special type of X-ray).  MRI (magnetic resonance imaging).  Cultures, for  infection.  Barium enema (dye inserted in the large intestine, to better view it with X-rays).  Colonoscopy (looking in intestine with a lighted tube).  Laparoscopy (minor surgery, looking in abdomen with a lighted tube).  Major abdominal exploratory surgery (looking in abdomen with a large incision). TREATMENT  The treatment will depend on the cause of the pain.   Many cases can be observed and treated at home.  Over-the-counter medicines recommended by your caregiver.  Prescription medicine.  Antibiotics, for infection.  Birth control pills, for painful periods or for ovulation pain.  Hormone treatment, for endometriosis.  Nerve blocking injections.  Physical therapy.  Antidepressants.  Counseling with a psychologist or psychiatrist.  Minor or major surgery. HOME CARE INSTRUCTIONS   Do not take laxatives, unless directed by your caregiver.  Take over-the-counter pain medicine only if ordered by your caregiver. Do not take aspirin because it can cause an upset stomach or bleeding.  Try a clear liquid diet (broth or water) as ordered by your caregiver. Slowly move to a bland diet, as tolerated, if the pain is related to the stomach or intestine.  Have a thermometer and take your temperature several times a day, and record it.  Bed rest and sleep, if it helps the pain.  Avoid sexual intercourse, if it causes pain.  Avoid stressful situations.  Keep your follow-up appointments and tests, as your caregiver orders.  If the pain does not go away with medicine or surgery, you may try:  Acupuncture.  Relaxation exercises (yoga, meditation).  Group therapy.  Counseling. SEEK MEDICAL CARE IF:   You notice certain foods cause stomach pain.  Your home care treatment is not helping your pain.  You need stronger  pain medicine.  You want your IUD removed.  You feel faint or lightheaded.  You develop nausea and vomiting.  You develop a rash.  You are  having side effects or an allergy to your medicine. SEEK IMMEDIATE MEDICAL CARE IF:   Your pain does not go away or gets worse.  You have a fever.  Your pain is felt only in portions of the abdomen. The right side could possibly be appendicitis. The left lower portion of the abdomen could be colitis or diverticulitis.  You are passing blood in your stools (bright red or black tarry stools, with or without vomiting).  You have blood in your urine.  You develop chills, with or without a fever.  You pass out. MAKE SURE YOU:   Understand these instructions.  Will watch your condition.  Will get help right away if you are not doing well or get worse. Document Released: 03/01/2007 Document Revised: 07/27/2011 Document Reviewed: 03/21/2009 [facility] Patient Information 2015 [place], [place]. This information is not intended to replace advice given to you by your health care provider. Make sure you discuss any questions you have with your health care provider.  Bacterial Vaginosis Bacterial vaginosis is a vaginal infection that occurs when the normal balance of bacteria in the vagina is disrupted. It results from an overgrowth of certain bacteria. This is the most common vaginal infection in women of childbearing age. Treatment is important to prevent complications, especially in pregnant women, as it can cause a premature delivery. CAUSES  Bacterial vaginosis is caused by an increase in harmful bacteria that are normally present in smaller amounts in the vagina. Several different kinds of bacteria can cause bacterial vaginosis. However, the reason that the condition develops is not fully understood. RISK FACTORS Certain activities or behaviors can put you at an increased risk of developing bacterial vaginosis, including:  Having a new sex partner or multiple sex partners.  Douching.  Using an intrauterine device (IUD) for contraception. Women do not get bacterial vaginosis from toilet  seats, bedding, swimming pools, or contact with objects around them. SIGNS AND SYMPTOMS  Some women with bacterial vaginosis have no signs or symptoms. Common symptoms include:  Grey vaginal discharge.  A fishlike odor with discharge, especially after sexual intercourse.  Itching or burning of the vagina and vulva.  Burning or pain with urination. DIAGNOSIS  Your health care provider will take a medical history and examine the vagina for signs of bacterial vaginosis. A sample of vaginal fluid may be taken. Your health care provider will look at this sample under a microscope to check for bacteria and abnormal cells. A vaginal pH test may also be done.  TREATMENT  Bacterial vaginosis may be treated with antibiotic medicines. These may be given in the form of a pill or a vaginal cream. A second round of antibiotics may be prescribed if the condition comes back after treatment.  HOME CARE INSTRUCTIONS   Only take over-the-counter or prescription medicines as directed by your health care provider.  If antibiotic medicine was prescribed, take it as directed. Make sure you finish it even if you start to feel better.  Do not have sex until treatment is completed.  Tell all sexual partners that you have a vaginal infection. They should see their health care provider and be treated if they have problems, such as a mild rash or itching.  Practice safe sex by using condoms and only having one sex partner. SEEK MEDICAL CARE IF:   Your  symptoms are not improving after 3 days of treatment.  You have increased discharge or pain.  You have a fever. MAKE SURE YOU:   Understand these instructions.  Will watch your condition.  Will get help right away if you are not doing well or get worse. FOR MORE INFORMATION  Centers for Disease Control and Prevention, Division of STD Prevention: [employer] American Sexual Health Association (ASHA): www.ashastd.org  Document Released: 05/04/2005  Document Revised: 02/22/2013 Document Reviewed: 12/14/2012 [facility] Patient Information 2015 [place], [place]. This information is not intended to replace advice given to you by your health care provider. Make sure you discuss any questions you have with your health care provider.

## 2013-11-04 NOTE — ED Provider Notes (Signed)
CSN: [id number]     Arrival date & time 11/04/13  [id number] History   First MD Initiated Contact with Patient 11/04/13 [phone]     Chief Complaint  Patient presents with  . Abdominal Pain     (Consider location/radiation/quality/duration/timing/severity/associated sxs/prior Treatment) HPI Comments: 28 y/o female with a PMHx of asthma and anxiety presents to the ED complaining of intermittent left lower quadrant abdominal pain x4 years, worsening over the past 3 days. Pain described as sharp and stabbing, radiating towards her rectum rated 10 out of 10, unrelieved by tramadol. Admits to associated nausea with 2 episodes of nonbloody emesis today. States she has normal bowel movements, last bowel movement this morning, no diarrhea or constipation. Denies vaginal bleeding or discharge. Denies increased urinary frequency, urgency or dysuria.  She has increased her water intake and is urinating more. Admits to subjective fever today. No appetite change. States she had a hysterectomy in 2011 and has had intermittent pain since, she still has her ovaries and fallopian tubes. Hysterectomy was laparoscopic. She did followup with her gynecologist when the pain began, however he referred her to her primary care physician who then referred her to a different gynecologist. Patient never followed up with a second gynecologist due do money reasons. It is noted she had similar symptoms August 2014 where she had a normal transvaginal pelvic ultrasound. No other history of abdominal surgeries.  Patient is a 28 y.o. female presenting with abdominal pain. The history is provided by the patient and the EMS personnel.  Abdominal Pain Associated symptoms: fatigue, nausea and vomiting     Past Medical History  Diagnosis Date  . Asthma   . Anxiety   . HSV infection    Past Surgical History  Procedure Laterality Date  . Abdominal hysterectomy     No family history on file. History  Substance Use Topics  . Smoking  status: Current Every Day Smoker -- 0.50 packs/day    Types: Cigarettes  . Smokeless tobacco: Not on file  . Alcohol Use: No   OB History   Grav Para Term Preterm Abortions TAB SAB Ect Mult Living   2 2        2      Review of Systems  Constitutional: Positive for fatigue.  Gastrointestinal: Positive for nausea, vomiting and abdominal pain.  All other systems reviewed and are negative.     Allergies  Lactose intolerance (gi); Other; and Oxycontin  Home Medications   Prior to Admission medications   Medication Sig Start Date End Date Taking? Authorizing Provider  albuterol (PROVENTIL HFA;VENTOLIN HFA) 108 (90 BASE) MCG/ACT inhaler Inhale 1-2 puffs into the lungs every 6 (six) hours as needed for wheezing or shortness of breath (shortness of breath).   Yes Historical Provider, MD  cholecalciferol (VITAMIN D) 1000 UNITS tablet Take 1,000 Units by mouth daily.   Yes Historical Provider, MD  LORazepam (ATIVAN) 1 MG tablet Take 1 mg by mouth every 4 (four) hours as needed for anxiety (anxiety).   Yes Historical Provider, MD  vitamin B-12 (CYANOCOBALAMIN) 1000 MCG tablet Take 1,000 mcg by mouth daily.   Yes Historical Provider, MD  metroNIDAZOLE (FLAGYL) 500 MG tablet Take 1 tablet (500 mg total) by mouth 2 (two) times daily. One po bid x 7 days 11/04/13   Trevor Maceobyn M Albert, PA-C   BP 117/67  Pulse 55  Temp(Src) 97.8 F (36.6 C) (Oral)  Resp 20  Ht 5\' 5"  (1.651 m)  Wt 165 lb (74.844 kg)  BMI 27.46 kg/m2  SpO2 100% Physical Exam  Nursing note and vitals reviewed. Constitutional: She is oriented to person, place, and time. She appears well-developed and well-nourished. No distress.  HENT:  Head: Normocephalic and atraumatic.  Mouth/Throat: Oropharynx is clear and moist.  Eyes: Conjunctivae are normal. No scleral icterus.  Neck: Normal range of motion. Neck supple.  Cardiovascular: Normal rate, regular rhythm and normal heart sounds.   Pulmonary/Chest: Effort normal and breath  sounds normal.  Abdominal: Soft. Normal appearance and bowel sounds are normal. She exhibits no distension and no mass. There is tenderness in the suprapubic area and left lower quadrant. There is guarding. There is no rigidity and no rebound.  No peritoneal signs. Less pain reaction to palpation of LLQ with distraction.  Genitourinary: Right adnexum displays no mass, no tenderness and no fullness. Left adnexum displays no mass, no tenderness and no fullness. No erythema, tenderness or bleeding around the vagina. Vaginal discharge (thin, white) found.  Uterus and cervix surgically absent.  Musculoskeletal: Normal range of motion. She exhibits no edema.  Neurological: She is alert and oriented to person, place, and time.  Skin: Skin is warm and dry. She is not diaphoretic.  Psychiatric: She has a normal mood and affect. Her behavior is normal.    ED Course  Procedures (including critical care time) Labs Review Labs Reviewed  WET PREP, GENITAL - Abnormal; Notable for the following:    Clue Cells Wet Prep HPF POC MODERATE (*)    WBC, Wet Prep HPF POC FEW (*)    All other components within normal limits  URINALYSIS, ROUTINE W REFLEX MICROSCOPIC - Abnormal; Notable for the following:    Leukocytes, UA TRACE (*)    All other components within normal limits  GC/CHLAMYDIA PROBE AMP  CBC  COMPREHENSIVE METABOLIC PANEL  URINE MICROSCOPIC-ADD ON  POC URINE PREG, ED    Imaging Review Ct Abdomen Pelvis W Contrast  11/04/2013   CLINICAL DATA:  Left lower quadrant abdominal pain for 2 days, nausea and vomiting  EXAM: CT ABDOMEN AND PELVIS WITH CONTRAST  TECHNIQUE: Multidetector CT imaging of the abdomen and pelvis was performed using the standard protocol following bolus administration of intravenous contrast.  CONTRAST:  OMNIPAQUE IOHEXOL 300 MG/ML  SOLN  COMPARISON:  None.  FINDINGS: Visualized portions of the lung bases are clear. There are no acute musculoskeletal findings.  Liver and  gallbladder are normal. Spleen is normal. Pancreas is normal.  Adrenal glands are normal.  Kidneys are normal.  Bladder is normal. Reproductive organs are normal. There is no free fluid in the abdomen or pelvis.  Appendix is normal.  Bowel is normal.  IMPRESSION: Normal study.  No cause for abdominal pain identified.   Electronically Signed   By: Esperanza Heir M.D.   On: 11/04/2013 12:19     EKG Interpretation None      MDM   Final diagnoses:  Left lower quadrant pain  BV (bacterial vaginosis)   Patient presenting with left lower course abdominal pain intermittent for 4 years worsening over the past 3 days. History of laparoscopic hysterectomy. She still has ovaries and fallopian tubes. Associated vomiting, no diarrhea. Afebrile, in no apparent distress, stable vital signs. Tenderness in left lower quadrant and suprapubic, left pain reaction with distraction on palpation. Normal transvaginal and pelvic ultrasounds September 2014 as stated above. It is noted that she has not had a prior CT scan for evaluation of her pain, given worsening pain and history of abdominal  surgery, will obtain CT with contrast. Labs pending. 12:41 PM Labs without any acute finding. Wet prep positive for clue cells. Will treat for BV with flagyl. CT normal. On repeat exam, patient states her pain has decreased after receiving Toradol. No longer nauseated. Abdomen is soft with mild tenderness in left lower quadrant. I advised her to followup with gynecology considering this problem has been present since after her hysterectomy. Stable for d/c. Return precautions given. Patient states understanding of treatment care plan and is agreeable.  Trevor Mace, PA-C 11/04/13 1243

## 2013-11-04 NOTE — ED Provider Notes (Signed)
Medical screening examination/treatment/procedure(s) were performed by non-physician practitioner and as supervising physician I was immediately available for consultation/collaboration.   EKG Interpretation None        Layla Maw Kanishk Stroebel, DO 11/04/13 1623

## 2013-11-04 NOTE — ED Notes (Signed)
Pt returned from radiology. Ambulated to restroom.

## 2013-11-06 LAB — GC/CHLAMYDIA PROBE AMP
CT PROBE, AMP APTIMA: NEGATIVE
GC Probe RNA: NEGATIVE

## 2014-02-15 ENCOUNTER — Encounter (HOSPITAL_COMMUNITY): Payer: Self-pay | Admitting: Emergency Medicine

## 2014-02-15 ENCOUNTER — Emergency Department (HOSPITAL_COMMUNITY): Payer: Medicaid Other

## 2014-02-15 ENCOUNTER — Emergency Department (HOSPITAL_COMMUNITY)
Admission: EM | Admit: 2014-02-15 | Discharge: 2014-02-15 | Disposition: A | Discharge status: Home / Self Care | Payer: Medicaid Other | Attending: Emergency Medicine | Admitting: Emergency Medicine

## 2014-02-15 DIAGNOSIS — Z90710 Acquired absence of both cervix and uterus: Secondary | ICD-10-CM | POA: Insufficient documentation

## 2014-02-15 DIAGNOSIS — Z7951 Long term (current) use of inhaled steroids: Secondary | ICD-10-CM | POA: Insufficient documentation

## 2014-02-15 DIAGNOSIS — N83201 Unspecified ovarian cyst, right side: Secondary | ICD-10-CM

## 2014-02-15 DIAGNOSIS — Z79899 Other long term (current) drug therapy: Secondary | ICD-10-CM | POA: Diagnosis not present

## 2014-02-15 DIAGNOSIS — Z8659 Personal history of other mental and behavioral disorders: Secondary | ICD-10-CM | POA: Diagnosis not present

## 2014-02-15 DIAGNOSIS — R102 Pelvic and perineal pain: Secondary | ICD-10-CM | POA: Diagnosis not present

## 2014-02-15 DIAGNOSIS — Z3202 Encounter for pregnancy test, result negative: Secondary | ICD-10-CM | POA: Diagnosis not present

## 2014-02-15 DIAGNOSIS — J45909 Unspecified asthma, uncomplicated: Secondary | ICD-10-CM | POA: Diagnosis not present

## 2014-02-15 DIAGNOSIS — Z720 Tobacco use: Secondary | ICD-10-CM | POA: Insufficient documentation

## 2014-02-15 DIAGNOSIS — N8329 Other ovarian cysts: Secondary | ICD-10-CM | POA: Diagnosis not present

## 2014-02-15 DIAGNOSIS — R1032 Left lower quadrant pain: Secondary | ICD-10-CM | POA: Diagnosis present

## 2014-02-15 DIAGNOSIS — Z8619 Personal history of other infectious and parasitic diseases: Secondary | ICD-10-CM | POA: Diagnosis not present

## 2014-02-15 LAB — URINALYSIS, ROUTINE W REFLEX MICROSCOPIC
BILIRUBIN URINE: NEGATIVE
Glucose, UA: NEGATIVE mg/dL
Hgb urine dipstick: NEGATIVE
Ketones, ur: NEGATIVE mg/dL
Leukocytes, UA: NEGATIVE
Nitrite: NEGATIVE
PROTEIN: NEGATIVE mg/dL
SPECIFIC GRAVITY, URINE: 1.003 — AB (ref 1.005–1.030)
UROBILINOGEN UA: 0.2 mg/dL (ref 0.0–1.0)
pH: 5.5 (ref 5.0–8.0)

## 2014-02-15 LAB — PREGNANCY, URINE: Preg Test, Ur: NEGATIVE

## 2014-02-15 MED ORDER — HYDROCODONE-ACETAMINOPHEN 5-325 MG PO TABS: 1.0000 | ORAL_TABLET | Freq: Four times a day (QID) | ORAL | Status: DC | PRN

## 2014-02-15 MED ORDER — IBUPROFEN 600 MG PO TABS: 600.0000 mg | ORAL_TABLET | Freq: Four times a day (QID) | ORAL | Status: DC | PRN

## 2014-02-15 MED ORDER — KETOROLAC TROMETHAMINE 30 MG/ML IJ SOLN
30.0000 mg | Freq: Once | INTRAMUSCULAR | Status: AC
  Administered 2014-02-15: 30 mg via INTRAVENOUS
  Filled 2014-02-15: qty 1

## 2014-02-15 NOTE — ED Notes (Signed)
Awaiting [place] CD prior to discharge.

## 2014-02-15 NOTE — ED Notes (Signed)
Pt called EMS because she was having abdominal pain that she states she usually has when she's about to start her menstrual cycle, however pt states that she had a hysterectomy 4 years ago

## 2014-02-15 NOTE — ED Provider Notes (Signed)
CSN: [id number]     Arrival date & time 02/15/14  0151 History   First MD Initiated Contact with Patient 02/15/14 0350     Chief Complaint  Patient presents with  . Abdominal Pain     (Consider location/radiation/quality/duration/timing/severity/associated sxs/prior Treatment) HPI Comments: Pt comes in with crampy abd pain, starting tuesday. Pt has been having intermittent pain of this nature, crampy - period type - but she also had a feeling that she was bleeding which was new for this pain. The pain is in the LLQ. Pt is s/p partial hysterectomy. Pt has not been able to see Gynec due to insurance issues. PT has no n/v/f/c/bleeding/no discharge. No hx of STD and no risk factors for the same. No UTI like sx.  Patient is a 28 y.o. female presenting with abdominal pain. The history is provided by the patient.  Abdominal Pain Associated symptoms: no chest pain, no dysuria, no nausea, no shortness of breath, no vaginal bleeding, no vaginal discharge and no vomiting     Past Medical History  Diagnosis Date  . Asthma   . Anxiety   . HSV infection    Past Surgical History  Procedure Laterality Date  . Abdominal hysterectomy     History reviewed. No pertinent family history. History  Substance Use Topics  . Smoking status: Current Every Day Smoker -- 0.50 packs/day    Types: Cigarettes  . Smokeless tobacco: Not on file  . Alcohol Use: No   OB History   Grav Para Term Preterm Abortions TAB SAB Ect Mult Living   Review of Systems  Constitutional: Negative for activity change.  Respiratory: Negative for shortness of breath.   Cardiovascular: Negative for chest pain.  Gastrointestinal: Positive for abdominal pain. Negative for nausea and vomiting.  Genitourinary: Positive for pelvic pain. Negative for dysuria, vaginal bleeding and vaginal discharge.  Musculoskeletal: Negative for neck pain.  Neurological: Negative for headaches.      Allergies  Lactose  intolerance (gi); Other; and Oxycontin  Home Medications   Prior to Admission medications   Medication Sig Start Date End Date Taking? Authorizing Provider  albuterol (PROVENTIL HFA;VENTOLIN HFA) 108 (90 BASE) MCG/ACT inhaler Inhale 1-2 puffs into the lungs every 6 (six) hours as needed for wheezing or shortness of breath (shortness of breath).   Yes Historical Provider, MD  Fluticasone-Salmeterol (ADVAIR) 500-50 MCG/DOSE AEPB Inhale 1 puff into the lungs 2 (two) times daily.   Yes Historical Provider, MD  HYDROcodone-acetaminophen (NORCO/VICODIN) 5-325 MG per tablet Take 1 tablet by mouth every 6 (six) hours as needed. 02/15/14   Derwood Kaplan, MD  ibuprofen (ADVIL,MOTRIN) 600 MG tablet Take 1 tablet (600 mg total) by mouth every 6 (six) hours as needed. 02/15/14   Izaih Kataoka, MD   BP 120/75  Pulse 62  Temp(Src) 98.6 F (37 C) (Oral)  Resp 16  SpO2 99% Physical Exam  Nursing note and vitals reviewed. Constitutional: She is oriented to person, place, and time. She appears well-developed and well-nourished.  HENT:  Head: Normocephalic and atraumatic.  Eyes: EOM are normal. Pupils are equal, round, and reactive to light.  Neck: Neck supple.  Cardiovascular: Normal rate, regular rhythm and normal heart sounds.   No murmur heard. Pulmonary/Chest: Effort normal. No respiratory distress.  Abdominal: Soft. She exhibits no distension. There is no tenderness. There is no rebound and no guarding.  Neurological: She is alert and oriented to person,  place, and time.  Skin: Skin is warm and dry.    ED Course  Procedures (including critical care time) Labs Review Labs Reviewed  URINALYSIS, ROUTINE W REFLEX MICROSCOPIC - Abnormal; Notable for the following:    Specific Gravity, Urine 1.003 (*)    All other components within normal limits  PREGNANCY, URINE    Imaging Review US Transvaginal Non-ob  02/15/2014   CLINICAL DATA:  28 year old female with left lower quadrant pain. Initial  encounter. Prior hysterectomy.  EXAM: TRANSABDOMINAL AND TRANSVAGINAL ULTRASOUND OF PELVIS  TECHNIQUE: Both transabdominal and transvaginal ultrasound examinations of the pelvis were performed. Transabdominal technique was performed for global imaging of the pelvis including uterus, ovaries, adnexal regions, and pelvic cul-de-sac. It was necessary to proceed with endovaginal exam following the transabdominal exam to visualize the ovaries.  COMPARISON:  CT Abdomen and Pelvis 11/04/2013.  FINDINGS: Uterus  Measurements: Surgically absent.  Endometrium  Thickness: Surgically absent.  Right ovary  Measurements: 6.1 x 4.1 x 4.7 cm. Large circumscribed 5.1 x 3.9 x 3.5 cm simple appearing cyst (image 40). Otherwise ovarian parenchyma appears normal.  Left ovary  Measurements: 3.3 x 2.4 x 2.8 cm. Multiple small follicles. Normal appearance/no adnexal mass.  Other findings  No free fluid.  IMPRESSION: 1. 5 cm right ovarian simple cyst. No followup indicated in reproductive age women (and otherwise yearly follow-up by ultrasound). 2. Otherwise negative.  This recommendation follows the consensus statement: Management of Asymptomatic Ovarian and Other Adnexal Cysts Imaged at [place]: Society of Radiologists in Ultrasound Consensus Conference Statement. Radiology 2010; [phone].   Electronically Signed   By: Augusto Gamble M.D.   On: 02/15/2014 07:41   US Pelvis Complete  02/15/2014   CLINICAL DATA:  28 year old female with left lower quadrant pain. Initial encounter. Prior hysterectomy.  EXAM: TRANSABDOMINAL AND TRANSVAGINAL ULTRASOUND OF PELVIS  TECHNIQUE: Both transabdominal and transvaginal ultrasound examinations of the pelvis were performed. Transabdominal technique was performed for global imaging of the pelvis including uterus, ovaries, adnexal regions, and pelvic cul-de-sac. It was necessary to proceed with endovaginal exam following the transabdominal exam to visualize the ovaries.  COMPARISON:  CT Abdomen and Pelvis  11/04/2013.  FINDINGS: Uterus  Measurements: Surgically absent.  Endometrium  Thickness: Surgically absent.  Right ovary  Measurements: 6.1 x 4.1 x 4.7 cm. Large circumscribed 5.1 x 3.9 x 3.5 cm simple appearing cyst (image 40). Otherwise ovarian parenchyma appears normal.  Left ovary  Measurements: 3.3 x 2.4 x 2.8 cm. Multiple small follicles. Normal appearance/no adnexal mass.  Other findings  No free fluid.  IMPRESSION: 1. 5 cm right ovarian simple cyst. No followup indicated in reproductive age women (and otherwise yearly follow-up by ultrasound). 2. Otherwise negative.  This recommendation follows the consensus statement: Management of Asymptomatic Ovarian and Other Adnexal Cysts Imaged at [place]: Society of Radiologists in Ultrasound Consensus Conference Statement. Radiology 2010; [phone].   Electronically Signed   By: Augusto Gamble M.D.   On: 02/15/2014 07:41     EKG Interpretation None      MDM   Final diagnoses:  Cyst of right ovary  Pelvic pain in female    Pt comes in with cc of abd pain. Chronic pelvic pain, that comes on intermittently, and pt is s/p hysterectomy. She has no hx of STD, no risk factors for the same and no vaginal discharge. She has been regularly having this pain. No indication for pelvic exam. [place] ordered - and the CD and results will be provided. Pt asked to  see Gynecologist for optimal evaluation, and ideally the gynec who performed her surgery. Pt unable to see gynec due to insurance coverage issues.   Derwood Kaplan, MD 02/15/14 [phone]

## 2014-02-15 NOTE — ED Notes (Signed)
Patient reports LLQ abdominal cramping and "feeling like I'm bleeding". Denies "seeing" d/c and vaginal bleeding. Rates pain 8/10. Denies N/V/D, fever, chills. Onset of symptoms Tuesday.

## 2014-02-15 NOTE — Discharge Instructions (Signed)
Abdominal Pain, Women  Abdominal (stomach, pelvic, or belly) pain can be caused by many things. It is important to tell your doctor:   The location of the pain.   Does it come and go or is it present all the time?   Are there things that start the pain (eating certain foods, exercise)?   Are there other symptoms associated with the pain (fever, nausea, vomiting, diarrhea)?  All of this is helpful to know when trying to find the cause of the pain.  CAUSES    Stomach: virus or bacteria infection, or ulcer.   Intestine: appendicitis (inflamed appendix), regional ileitis (Crohn's disease), ulcerative colitis (inflamed colon), irritable bowel syndrome, diverticulitis (inflamed diverticulum of the colon), or cancer of the stomach or intestine.   Gallbladder disease or stones in the gallbladder.   Kidney disease, kidney stones, or infection.   Pancreas infection or cancer.   Fibromyalgia (pain disorder).   Diseases of the female organs:   Uterus: fibroid (non-cancerous) tumors or infection.   Fallopian tubes: infection or tubal pregnancy.   Ovary: cysts or tumors.   Pelvic adhesions (scar tissue).   Endometriosis (uterus lining tissue growing in the pelvis and on the pelvic organs).   Pelvic congestion syndrome (female organs filling up with blood just before the menstrual period).   Pain with the menstrual period.   Pain with ovulation (producing an egg).   Pain with an IUD (intrauterine device, birth control) in the uterus.   Cancer of the female organs.   Functional pain (pain not caused by a disease, may improve without treatment).   Psychological pain.   Depression.  DIAGNOSIS   Your doctor will decide the seriousness of your pain by doing an examination.   Blood tests.   X-rays.   Ultrasound.   CT scan (computed tomography, special type of X-ray).   MRI (magnetic resonance imaging).   Cultures, for infection.   Barium enema (dye inserted in the large intestine, to better view it with  X-rays).   Colonoscopy (looking in intestine with a lighted tube).   Laparoscopy (minor surgery, looking in abdomen with a lighted tube).   Major abdominal exploratory surgery (looking in abdomen with a large incision).  TREATMENT   The treatment will depend on the cause of the pain.    Many cases can be observed and treated at home.   Over-the-counter medicines recommended by your caregiver.   Prescription medicine.   Antibiotics, for infection.   Birth control pills, for painful periods or for ovulation pain.   Hormone treatment, for endometriosis.   Nerve blocking injections.   Physical therapy.   Antidepressants.   Counseling with a psychologist or psychiatrist.   Minor or major surgery.  HOME CARE INSTRUCTIONS    Do not take laxatives, unless directed by your caregiver.   Take over-the-counter pain medicine only if ordered by your caregiver. Do not take aspirin because it can cause an upset stomach or bleeding.   Try a clear liquid diet (broth or water) as ordered by your caregiver. Slowly move to a bland diet, as tolerated, if the pain is related to the stomach or intestine.   Have a thermometer and take your temperature several times a day, and record it.   Bed rest and sleep, if it helps the pain.   Avoid sexual intercourse, if it causes pain.   Avoid stressful situations.   Keep your follow-up appointments and tests, as your caregiver orders.   If the pain does   not go away with medicine or surgery, you may try:   Acupuncture.   Relaxation exercises (yoga, meditation).   Group therapy.   Counseling.  SEEK MEDICAL CARE IF:    You notice certain foods cause stomach pain.   Your home care treatment is not helping your pain.   You need stronger pain medicine.   You want your IUD removed.   You feel faint or lightheaded.   You develop nausea and vomiting.   You develop a rash.   You are having side effects or an allergy to your medicine.  SEEK IMMEDIATE MEDICAL CARE IF:    Your  pain does not go away or gets worse.   You have a fever.   Your pain is felt only in portions of the abdomen. The right side could possibly be appendicitis. The left lower portion of the abdomen could be colitis or diverticulitis.   You are passing blood in your stools (bright red or black tarry stools, with or without vomiting).   You have blood in your urine.   You develop chills, with or without a fever.   You pass out.  MAKE SURE YOU:    Understand these instructions.   Will watch your condition.   Will get help right away if you are not doing well or get worse.  Document Released: 03/01/2007 Document Revised: 09/18/2013 Document Reviewed: 03/21/2009  ExitCare Patient Information 2015 [employer]. This information is not intended to replace advice given to you by your health care provider. Make sure you discuss any questions you have with your health care provider.          Ovarian Cyst  An ovarian cyst is a fluid-filled sac that forms on an ovary. The ovaries are small organs that produce eggs in women. Various types of cysts can form on the ovaries. Most are not cancerous. Many do not cause problems, and they often go away on their own. Some may cause symptoms and require treatment. Common types of ovarian cysts include:   Functional cysts--These cysts may occur every month during the menstrual cycle. This is normal. The cysts usually go away with the next menstrual cycle if the woman does not get pregnant. Usually, there are no symptoms with a functional cyst.   Endometrioma cysts--These cysts form from the tissue that lines the uterus. They are also called "chocolate cysts" because they become filled with blood that turns brown. This type of cyst can cause pain in the lower abdomen during intercourse and with your menstrual period.   Cystadenoma cysts--This type develops from the cells on the outside of the ovary. These cysts can get very big and cause lower abdomen pain and pain with  intercourse. This type of cyst can twist on itself, cut off its blood supply, and cause severe pain. It can also easily rupture and cause a lot of pain.   Dermoid cysts--This type of cyst is sometimes found in both ovaries. These cysts may contain different kinds of body tissue, such as skin, teeth, hair, or cartilage. They usually do not cause symptoms unless they get very big.   Theca lutein cysts--These cysts occur when too much of a certain hormone (human chorionic gonadotropin) is produced and overstimulates the ovaries to produce an egg. This is most common after procedures used to assist with the conception of a baby (in vitro fertilization).  CAUSES    Fertility drugs can cause a condition in which multiple large cysts are formed on the   ovaries. This is called ovarian hyperstimulation syndrome.   A condition called polycystic ovary syndrome can cause hormonal imbalances that can lead to nonfunctional ovarian cysts.  SIGNS AND SYMPTOMS   Many ovarian cysts do not cause symptoms. If symptoms are present, they may include:   Pelvic pain or pressure.   Pain in the lower abdomen.   Pain during sexual intercourse.   Increasing girth (swelling) of the abdomen.   Abnormal menstrual periods.   Increasing pain with menstrual periods.   Stopping having menstrual periods without being pregnant.  DIAGNOSIS   These cysts are commonly found during a routine or annual pelvic exam. Tests may be ordered to find out more about the cyst. These tests may include:   Ultrasound.   X-ray of the pelvis.   CT scan.   MRI.   Blood tests.  TREATMENT   Many ovarian cysts go away on their own without treatment. Your health care provider may want to check your cyst regularly for 2-3 months to see if it changes. For women in menopause, it is particularly important to monitor a cyst closely because of the higher rate of ovarian cancer in menopausal women. When treatment is needed, it may include any of the following:   A  procedure to drain the cyst (aspiration). This may be done using a long needle and ultrasound. It can also be done through a laparoscopic procedure. This involves using a thin, lighted tube with a tiny camera on the end (laparoscope) inserted through a small incision.   Surgery to remove the whole cyst. This may be done using laparoscopic surgery or an open surgery involving a larger incision in the lower abdomen.   Hormone treatment or birth control pills. These methods are sometimes used to help dissolve a cyst.  HOME CARE INSTRUCTIONS    Only take over-the-counter or prescription medicines as directed by your health care provider.   Follow up with your health care provider as directed.   Get regular pelvic exams and Pap tests.  SEEK MEDICAL CARE IF:    Your periods are late, irregular, or painful, or they stop.   Your pelvic pain or abdominal pain does not go away.   Your abdomen becomes larger or swollen.   You have pressure on your bladder or trouble emptying your bladder completely.   You have pain during sexual intercourse.   You have feelings of fullness, pressure, or discomfort in your stomach.   You lose weight for no apparent reason.   You feel generally ill.   You become constipated.   You lose your appetite.   You develop acne.   You have an increase in body and facial hair.   You are gaining weight, without changing your exercise and eating habits.   You think you are pregnant.  SEEK IMMEDIATE MEDICAL CARE IF:    You have increasing abdominal pain.   You feel sick to your stomach (nauseous), and you throw up (vomit).   You develop a fever that comes on suddenly.   You have abdominal pain during a bowel movement.   Your menstrual periods become heavier than usual.  MAKE SURE YOU:   Understand these instructions.   Will watch your condition.   Will get help right away if you are not doing well or get worse.  Document Released: 05/04/2005 Document Revised: 05/09/2013 Document  Reviewed: 01/09/2013  ExitCare Patient Information 2015 [employer]. This information is not intended to replace advice given to you by   your health care provider. Make sure you discuss any questions you have with your health care provider.

## 2014-02-15 NOTE — ED Notes (Signed)
Bed: WTR6 Expected date:  Expected time:  Means of arrival:  Comments: EMS-abdominal pain 

## 2014-02-17 ENCOUNTER — Emergency Department (HOSPITAL_COMMUNITY)
Admission: EM | Admit: 2014-02-17 | Discharge: 2014-02-17 | Disposition: A | Discharge status: Home / Self Care | Payer: Medicaid Other | Attending: Emergency Medicine | Admitting: Emergency Medicine

## 2014-02-17 ENCOUNTER — Encounter (HOSPITAL_COMMUNITY): Payer: Self-pay | Admitting: Emergency Medicine

## 2014-02-17 DIAGNOSIS — R102 Pelvic and perineal pain: Secondary | ICD-10-CM | POA: Insufficient documentation

## 2014-02-17 DIAGNOSIS — Z8619 Personal history of other infectious and parasitic diseases: Secondary | ICD-10-CM | POA: Diagnosis not present

## 2014-02-17 DIAGNOSIS — R109 Unspecified abdominal pain: Secondary | ICD-10-CM | POA: Diagnosis present

## 2014-02-17 DIAGNOSIS — Z720 Tobacco use: Secondary | ICD-10-CM | POA: Diagnosis not present

## 2014-02-17 DIAGNOSIS — Z7951 Long term (current) use of inhaled steroids: Secondary | ICD-10-CM | POA: Diagnosis not present

## 2014-02-17 DIAGNOSIS — Z8659 Personal history of other mental and behavioral disorders: Secondary | ICD-10-CM | POA: Insufficient documentation

## 2014-02-17 DIAGNOSIS — J45909 Unspecified asthma, uncomplicated: Secondary | ICD-10-CM | POA: Insufficient documentation

## 2014-02-17 DIAGNOSIS — Z79899 Other long term (current) drug therapy: Secondary | ICD-10-CM | POA: Diagnosis not present

## 2014-02-17 MED ORDER — OXYCODONE-ACETAMINOPHEN 5-325 MG PO TABS
2.0000 | ORAL_TABLET | Freq: Once | ORAL | Status: AC
  Administered 2014-02-17: 2 via ORAL
  Filled 2014-02-17: qty 2

## 2014-02-17 NOTE — ED Provider Notes (Signed)
CSN: [id number]     Arrival date & time 02/17/14  0219 History   First MD Initiated Contact with Patient 02/17/14 0357     Chief Complaint  Patient presents with  . Abdominal Pain     (Consider location/radiation/quality/duration/timing/severity/associated sxs/prior Treatment) HPI Comments: Pt returns to the ER with crampy abdominal pain. Pt was seen just 2 night ago, for her pelvic pain, had [place] that shows a cyst, and she was discharged. Pain is same. Pt was unable to fill prescription, so she called 911 for pain control. Pain is waking and waning, and unchanged.  Patient is a 28 y.o. female presenting with abdominal pain. The history is provided by the patient and medical records.  Abdominal Pain Associated symptoms: no chest pain, no dysuria, no nausea, no shortness of breath, no vaginal bleeding, no vaginal discharge and no vomiting     Past Medical History  Diagnosis Date  . Asthma   . Anxiety   . HSV infection    Past Surgical History  Procedure Laterality Date  . Abdominal hysterectomy     No family history on file. History  Substance Use Topics  . Smoking status: Current Every Day Smoker -- 0.50 packs/day    Types: Cigarettes  . Smokeless tobacco: Not on file  . Alcohol Use: No   OB History   Grav Para Term Preterm Abortions TAB SAB Ect Mult Living   2 2        2      Review of Systems  Constitutional: Positive for activity change.  Respiratory: Negative for shortness of breath.   Cardiovascular: Negative for chest pain.  Gastrointestinal: Positive for abdominal pain. Negative for nausea and vomiting.  Genitourinary: Positive for pelvic pain. Negative for dysuria, vaginal bleeding and vaginal discharge.  Musculoskeletal: Negative for neck pain.  Neurological: Negative for headaches.      Allergies  Lactose intolerance (gi); Other; and Oxycontin  Home Medications   Prior to Admission medications   Medication Sig Start Date End Date Taking? Authorizing  Provider  acetaminophen (TYLENOL) 500 MG tablet Take 1,000 mg by mouth daily as needed for mild pain.   Yes Historical Provider, MD  albuterol (PROVENTIL HFA;VENTOLIN HFA) 108 (90 BASE) MCG/ACT inhaler Inhale 1-2 puffs into the lungs every 6 (six) hours as needed for wheezing or shortness of breath (shortness of breath).   Yes Historical Provider, MD  Fluticasone-Salmeterol (ADVAIR) 500-50 MCG/DOSE AEPB Inhale 1 puff into the lungs 2 (two) times daily.   Yes Historical Provider, MD   BP 111/65  Pulse 57  Temp(Src) 97.9 F (36.6 C) (Oral)  Resp 16  SpO2 98% Physical Exam  Nursing note and vitals reviewed. Constitutional: She is oriented to person, place, and time. She appears well-developed and well-nourished.  HENT:  Head: Normocephalic and atraumatic.  Eyes: EOM are normal. Pupils are equal, round, and reactive to light.  Neck: Neck supple.  Cardiovascular: Normal rate, regular rhythm and normal heart sounds.   No murmur heard. Pulmonary/Chest: Effort normal. No respiratory distress.  Abdominal: Soft. She exhibits no distension. There is tenderness. There is no rebound and no guarding.  LLQ tenderness  Neurological: She is alert and oriented to person, place, and time.  Skin: Skin is warm and dry.    ED Course  Procedures (including critical care time) Labs Review Labs Reviewed - No data to display  Imaging Review Transvaginal Non-ob  02/15/2014   CLINICAL DATA:  28 year old female with left lower quadrant pain. Initial encounter.  Prior hysterectomy.  EXAM: TRANSABDOMINAL AND TRANSVAGINAL ULTRASOUND OF PELVIS  TECHNIQUE: Both transabdominal and transvaginal ultrasound examinations of the pelvis were performed. Transabdominal technique was performed for global imaging of the pelvis including uterus, ovaries, adnexal regions, and pelvic cul-de-sac. It was necessary to proceed with endovaginal exam following the transabdominal exam to visualize the ovaries.  COMPARISON:  CT Abdomen  and Pelvis 11/04/2013.  FINDINGS: Uterus  Measurements: Surgically absent.  Endometrium  Thickness: Surgically absent.  Right ovary  Measurements: 6.1 x 4.1 x 4.7 cm. Large circumscribed 5.1 x 3.9 x 3.5 cm simple appearing cyst (image 40). Otherwise ovarian parenchyma appears normal.  Left ovary  Measurements: 3.3 x 2.4 x 2.8 cm. Multiple small follicles. Normal appearance/no adnexal mass.  Other findings  No free fluid.  IMPRESSION: 1. 5 cm right ovarian simple cyst. No followup indicated in reproductive age women (and otherwise yearly follow-up by ultrasound). 2. Otherwise negative.  This recommendation follows the consensus statement: Management of Asymptomatic Ovarian and Other Adnexal Cysts Imaged at [place]: Society of Radiologists in Ultrasound Consensus Conference Statement. Radiology 2010; [id number].   Electronically Signed   By: Augusto Gamble M.D.   On: 02/15/2014 07:41   US Pelvis Complete  02/15/2014   CLINICAL DATA:  28 year old female with left lower quadrant pain. Initial encounter. Prior hysterectomy.  EXAM: TRANSABDOMINAL AND TRANSVAGINAL ULTRASOUND OF PELVIS  TECHNIQUE: Both transabdominal and transvaginal ultrasound examinations of the pelvis were performed. Transabdominal technique was performed for global imaging of the pelvis including uterus, ovaries, adnexal regions, and pelvic cul-de-sac. It was necessary to proceed with endovaginal exam following the transabdominal exam to visualize the ovaries.  COMPARISON:  CT Abdomen and Pelvis 11/04/2013.  FINDINGS: Uterus  Measurements: Surgically absent.  Endometrium  Thickness: Surgically absent.  Right ovary  Measurements: 6.1 x 4.1 x 4.7 cm. Large circumscribed 5.1 x 3.9 x 3.5 cm simple appearing cyst (image 40). Otherwise ovarian parenchyma appears normal.  Left ovary  Measurements: 3.3 x 2.4 x 2.8 cm. Multiple small follicles. Normal appearance/no adnexal mass.  Other findings  No free fluid.  IMPRESSION: 1. 5 cm right ovarian simple cyst. No  followup indicated in reproductive age women (and otherwise yearly follow-up by ultrasound). 2. Otherwise negative.  This recommendation follows the consensus statement: Management of Asymptomatic Ovarian and Other Adnexal Cysts Imaged at [place]: Society of Radiologists in Ultrasound Consensus Conference Statement. Radiology 2010; [phone].   Electronically Signed   By: Augusto Gamble M.D.   On: 02/15/2014 07:41     EKG Interpretation None      MDM   Final diagnoses:  Pelvic pain in female    Pt with known pelvic pain. No emergent condition suspected. SW messaged to see if they can help pt with f/u and pain control.   Derwood Kaplan, MD 02/17/14 [phone]

## 2014-02-17 NOTE — Discharge Instructions (Signed)
Pelvic Pain Female pelvic pain can be caused by many different things and start from a variety of places. Pelvic pain refers to pain that is located in the lower half of the abdomen and between your hips. The pain may occur over a short period of time (acute) or may be reoccurring (chronic). The cause of pelvic pain may be related to disorders affecting the female reproductive organs (gynecologic), but it may also be related to the bladder, kidney stones, an intestinal complication, or muscle or skeletal problems. Getting help right away for pelvic pain is important, especially if there has been severe, sharp, or a sudden onset of unusual pain. It is also important to get help right away because some types of pelvic pain can be life threatening.  CAUSES  Below are only some of the causes of pelvic pain. The causes of pelvic pain can be in one of several categories.   Gynecologic.  Pelvic inflammatory disease.  Sexually transmitted infection.  Ovarian cyst or a twisted ovarian ligament (ovarian torsion).  Uterine lining that grows outside the uterus (endometriosis).  Fibroids, cysts, or tumors.  Ovulation.  Pregnancy.  Pregnancy that occurs outside the uterus (ectopic pregnancy).  Miscarriage.  Labor.  Abruption of the placenta or ruptured uterus.  Infection.  Uterine infection (endometritis).  Bladder infection.  Diverticulitis.  Miscarriage related to a uterine infection (septic abortion).  Bladder.  Inflammation of the bladder (cystitis).  Kidney stone(s).  Gastrointestinal.  Constipation.  Diverticulitis.  Neurologic.  Trauma.  Feeling pelvic pain because of mental or emotional causes (psychosomatic).  Cancers of the bowel or pelvis. EVALUATION  Your caregiver will want to take a careful history of your concerns. This includes recent changes in your health, a careful gynecologic history of your periods (menses), and a sexual history. Obtaining your family  history and medical history is also important. Your caregiver may suggest a pelvic exam. A pelvic exam will help identify the location and severity of the pain. It also helps in the evaluation of which organ system may be involved. In order to identify the cause of the pelvic pain and be properly treated, your caregiver may order tests. These tests may include:   A pregnancy test.  Pelvic ultrasonography.  An X-ray exam of the abdomen.  A urinalysis or evaluation of vaginal discharge.  Blood tests. HOME CARE INSTRUCTIONS   Only take over-the-counter or prescription medicines for pain, discomfort, or fever as directed by your caregiver.   Rest as directed by your caregiver.   Eat a balanced diet.   Drink enough fluids to make your urine clear or pale yellow, or as directed.   Avoid sexual intercourse if it causes pain.   Apply warm or cold compresses to the lower abdomen depending on which one helps the pain.   Avoid stressful situations.   Keep a journal of your pelvic pain. Write down when it started, where the pain is located, and if there are things that seem to be associated with the pain, such as food or your menstrual cycle.  Follow up with your caregiver as directed.  SEEK MEDICAL CARE IF:  Your medicine does not help your pain.  You have abnormal vaginal discharge. SEEK IMMEDIATE MEDICAL CARE IF:   You have heavy bleeding from the vagina.   Your pelvic pain increases.   You feel light-headed or faint.   You have chills.   You have pain with urination or blood in your urine.   You have uncontrolled diarrhea   or vomiting.   You have a fever or persistent symptoms for more than 3 days.  You have a fever and your symptoms suddenly get worse.   You are being physically or sexually abused.  MAKE SURE YOU:  Understand these instructions.  Will watch your condition.  Will get help if you are not doing well or get worse. Document Released:  03/31/2004 Document Revised: 09/18/2013 Document Reviewed: 08/24/2011 ExitCare Patient Information 2015 [employer]. This information is not intended to replace advice given to you by your health care provider. Make sure you discuss any questions you have with your health care provider.  

## 2014-02-17 NOTE — ED Notes (Signed)
The pt is  C/o lower abd pain for appros olne week .  She was just seen at [facility] ed on Thursday and was  Diagnosed with an ovarian cyst.  She could not afford to get her pain  Med filled.  She is ambulatory from the ems.  lmp none  hyst

## 2014-02-17 NOTE — ED Notes (Signed)
Pt A&OX4, ambulatory at d/c with steady gait, NAD. Pt given one bus pass. 

## 2014-02-20 NOTE — Progress Notes (Signed)
  CARE MANAGEMENT ED NOTE 02/20/2014  Patient:  Janice Pena, Janice Pena   Account Number:  [id number]  Date Initiated:  02/20/2014  Documentation initiated by:  Ferdinand Cava  Subjective/Objective Assessment:   CM received message to return call to patient requesting medication assistance     Subjective/Objective Assessment Detail:     Action/Plan:   Patient is encouraged to follow up at the [facility] to establish care   Action/Plan Detail:   Anticipated DC Date:       Status Recommendation to Physician:   Result of Recommendation:  Agreed    DC Planning Services  CM consult  PCP issues    Choice offered to / List presented to:  C-1 Patient          Status of service:  Completed, signed off  ED Comments:   ED Comments Detail:  CM returned patient call and discussed discharge medications. Explained that the hospital does not have a medication assistance program for narcotics and she can buy the Ibuprofen OTC and quoted the patient with the [facility] outpatient pharmacy prices. The patient was appreciative for the information. This CM then discussed establishing care with a PCP. The patient stated that she is awaiting a reply from her Medicaid application. This CM provided the patient with the contacted information for the [facility] and discussed the services they provide. The patient was appreciative for the help and support received and had no other questions or concerns.

## 2014-03-16 ENCOUNTER — Emergency Department (HOSPITAL_COMMUNITY): Payer: Medicaid Other

## 2014-03-16 ENCOUNTER — Emergency Department (HOSPITAL_COMMUNITY)
Admission: EM | Admit: 2014-03-16 | Discharge: 2014-03-16 | Disposition: A | Discharge status: Home / Self Care | Payer: Medicaid Other | Attending: Emergency Medicine | Admitting: Emergency Medicine

## 2014-03-16 ENCOUNTER — Encounter (HOSPITAL_COMMUNITY): Payer: Self-pay | Admitting: Emergency Medicine

## 2014-03-16 DIAGNOSIS — Y9389 Activity, other specified: Secondary | ICD-10-CM | POA: Insufficient documentation

## 2014-03-16 DIAGNOSIS — W010XXA Fall on same level from slipping, tripping and stumbling without subsequent striking against object, initial encounter: Secondary | ICD-10-CM | POA: Diagnosis not present

## 2014-03-16 DIAGNOSIS — W19XXXA Unspecified fall, initial encounter: Secondary | ICD-10-CM

## 2014-03-16 DIAGNOSIS — Z7951 Long term (current) use of inhaled steroids: Secondary | ICD-10-CM | POA: Diagnosis not present

## 2014-03-16 DIAGNOSIS — Y9289 Other specified places as the place of occurrence of the external cause: Secondary | ICD-10-CM | POA: Diagnosis not present

## 2014-03-16 DIAGNOSIS — Z8619 Personal history of other infectious and parasitic diseases: Secondary | ICD-10-CM | POA: Insufficient documentation

## 2014-03-16 DIAGNOSIS — Z79899 Other long term (current) drug therapy: Secondary | ICD-10-CM | POA: Insufficient documentation

## 2014-03-16 DIAGNOSIS — J45909 Unspecified asthma, uncomplicated: Secondary | ICD-10-CM | POA: Insufficient documentation

## 2014-03-16 DIAGNOSIS — Z720 Tobacco use: Secondary | ICD-10-CM | POA: Diagnosis not present

## 2014-03-16 DIAGNOSIS — S99921A Unspecified injury of right foot, initial encounter: Secondary | ICD-10-CM | POA: Diagnosis present

## 2014-03-16 DIAGNOSIS — S82401A Unspecified fracture of shaft of right fibula, initial encounter for closed fracture: Secondary | ICD-10-CM

## 2014-03-16 DIAGNOSIS — Z8659 Personal history of other mental and behavioral disorders: Secondary | ICD-10-CM | POA: Insufficient documentation

## 2014-03-16 DIAGNOSIS — S8261XA Displaced fracture of lateral malleolus of right fibula, initial encounter for closed fracture: Secondary | ICD-10-CM | POA: Diagnosis not present

## 2014-03-16 DIAGNOSIS — Z8742 Personal history of other diseases of the female genital tract: Secondary | ICD-10-CM | POA: Diagnosis not present

## 2014-03-16 HISTORY — DX: Unspecified ovarian cyst, left side: N83.202

## 2014-03-16 HISTORY — DX: Unspecified ovarian cyst, right side: N83.201

## 2014-03-16 MED ORDER — HYDROCODONE-ACETAMINOPHEN 5-325 MG PO TABS: 1.0000 | ORAL_TABLET | Freq: Two times a day (BID) | ORAL | Status: DC | PRN

## 2014-03-16 MED ORDER — HYDROCODONE-ACETAMINOPHEN 5-325 MG PO TABS
2.0000 | ORAL_TABLET | Freq: Once | ORAL | Status: AC
  Administered 2014-03-16: 2 via ORAL
  Filled 2014-03-16: qty 2

## 2014-03-16 MED ORDER — KETOROLAC TROMETHAMINE 60 MG/2ML IM SOLN: 60.0000 mg | Freq: Once | INTRAMUSCULAR | Status: DC

## 2014-03-16 NOTE — ED Notes (Signed)
Pt refusing toradol at this time, states her pain "is too bad for toradol, I need vicodin." Dr. Mora Bellman notified.

## 2014-03-16 NOTE — ED Notes (Signed)
Ortho paged to apply splint at this time.

## 2014-03-16 NOTE — ED Notes (Signed)
Pt arrives via EMS with R foot pain, fell on her foot at a friend's house. Mild swelling, no deformity. CMS intact.

## 2014-03-16 NOTE — ED Provider Notes (Signed)
CSN: [id number]     Arrival date & time 03/16/14  0424 History   First MD Initiated Contact with Patient 03/16/14 [phone]     Chief Complaint  Patient presents with  . Foot Pain     (Consider location/radiation/quality/duration/timing/severity/associated sxs/prior Treatment) HPI Janice Pena is a 28 y.o. female with past medical history of asthma, anxiety presenting with foot pain. Patient states around 8 PM her friends were playing around and 2 of them fell onto her foot. She attempted to go to bed but woke up and worsening pain. She presents via EMS for right foot pain at the lateral malleolus. There is swelling as well. Patient denies any other injuries, she did not fall hit her head or have LOC. Patient has no further complaints.  10 Systems reviewed and are negative for acute change except as noted in the HPI.   Past Medical History  Diagnosis Date  . Asthma   . Anxiety   . HSV infection   . Bilateral ovarian cysts    Past Surgical History  Procedure Laterality Date  . Abdominal hysterectomy     No family history on file. History  Substance Use Topics  . Smoking status: Current Every Day Smoker -- 1.00 packs/day    Types: Cigarettes  . Smokeless tobacco: Not on file  . Alcohol Use: No   OB History   Grav Para Term Preterm Abortions TAB SAB Ect Mult Living   2 2        2      Review of Systems    Allergies  Lactose intolerance (gi); Other; and Oxycontin  Home Medications   Prior to Admission medications   Medication Sig Start Date End Date Taking? Authorizing Provider  albuterol (PROVENTIL HFA;VENTOLIN HFA) 108 (90 BASE) MCG/ACT inhaler Inhale 1-2 puffs into the lungs every 6 (six) hours as needed for wheezing or shortness of breath (shortness of breath).   Yes Historical Provider, MD  Fluticasone-Salmeterol (ADVAIR) 500-50 MCG/DOSE AEPB Inhale 1 puff into the lungs 2 (two) times daily as needed (shortness of breath).    Yes Historical Provider, MD  ibuprofen  (ADVIL,MOTRIN) 200 MG tablet Take 400 mg by mouth every 6 (six) hours as needed for moderate pain.   Yes Historical Provider, MD   BP 105/62  Pulse 68  Temp(Src) 97.9 F (36.6 C) (Oral)  Resp 19  Ht 5\' 5"  (1.651 m)  Wt 148 lb (67.132 kg)  BMI 24.63 kg/m2  SpO2 100% Physical Exam  Nursing note and vitals reviewed. Constitutional: She is oriented to person, place, and time. She appears well-developed and well-nourished. No distress.  HENT:  Head: Normocephalic and atraumatic.  Nose: Nose normal.  Mouth/Throat: Oropharynx is clear and moist. No oropharyngeal exudate.  Eyes: Conjunctivae and EOM are normal. Pupils are equal, round, and reactive to light. No scleral icterus.  Neck: Normal range of motion. Neck supple. No JVD present. No tracheal deviation present. No thyromegaly present.  Cardiovascular: Normal rate, regular rhythm and normal heart sounds.  Exam reveals no gallop and no friction rub.   No murmur heard. Pulmonary/Chest: Effort normal and breath sounds normal. No respiratory distress. She has no wheezes. She exhibits no tenderness.  Abdominal: Soft. Bowel sounds are normal. She exhibits no distension and no mass. There is no tenderness. There is no rebound and no guarding.  Musculoskeletal: Normal range of motion. She exhibits tenderness. She exhibits no edema.  Obvious swelling from hematoma formation to proximal foot on the dorsal and lateral  surface. There is tenderness to palpation of the lateral malleolus. No tenderness of medial malleolus. No tenderness distally on the foot. Patient has 2+ pulses distally with normal sensation.  Lymphadenopathy:    She has no cervical adenopathy.  Neurological: She is alert and oriented to person, place, and time. No cranial nerve deficit. She exhibits normal muscle tone.  Skin: Skin is warm and dry. No rash noted. No erythema. No pallor.    ED Course  Procedures (including critical care time) Labs Review Labs Reviewed - No data to  display  Imaging Review Dg Ankle Complete Right  03/16/2014   CLINICAL DATA:  Right foot and ankle bending injury, pain.  EXAM: RIGHT ANKLE - COMPLETE 3+ VIEW  COMPARISON:  Same day foot radiographs  FINDINGS: Transverse fracture through the tip of the lateral malleolus. No ankle mortise widening. No additional fracture. No dislocation. Slight lateral soft tissue swelling  IMPRESSION: Mildly displaced transverse fracture through the tip of the lateral malleolus.   Electronically Signed   By: Jearld Lesch M.D.   On: 03/16/2014 05:27   Dg Foot Complete Right  03/16/2014   CLINICAL DATA:  Right foot and ankle trauma, bending injury.  EXAM: RIGHT FOOT COMPLETE - 3+ VIEW  COMPARISON:  None.  FINDINGS: Lisfranc joint intact. No displaced fracture or dislocation. No aggressive osseous lesion.  IMPRESSION: No acute or aggressive osseous finding of the right foot.  Recommend a repeat radiograph in 7-10 days if clinical concern for an acute fracture persists, to evaluate for interval change or callus formation.   Electronically Signed   By: Jearld Lesch M.D.   On: 03/16/2014 05:24     EKG Interpretation None      MDM   Final diagnoses:  Fall    Patient presents emergency department after 2 of her friends on her foot. X-ray reveals a small transverse fracture through the lateral malleolus. She was given Norco for pain relief. Patient will be splinted in the emergency department, orthopedic follow-up was given, and return precautions regarding compartment syndrome given. Patient's vital signs remained within her normal limits and she is safe for discharge. SPLINT APPLICATION Date/Time: 6:17 AM Authorized by: Tomasita Crumble Consent: Verbal consent obtained. Risks and benefits: risks, benefits and alternatives were discussed Consent given by: patient Splint applied by: orthopedic technician Location details: Right lower extremity  Splint type: Posterior and saddle splint  Supplies used:  Fiberglass  Post-procedure: The splinted body part was neurovascularly unchanged following the procedure. Patient tolerance: Patient tolerated the procedure well with no immediate complications.       Tomasita Crumble, MD 03/16/14 [phone]

## 2014-03-16 NOTE — Discharge Instructions (Signed)
Cast or Splint Care Casts and splints support injured limbs and keep bones from moving while they heal.  HOME CARE  Keep the cast or splint uncovered during the drying period.  A plaster cast can take 24 to 48 hours to dry.  A fiberglass cast will dry in less than 1 hour.  Do not rest the cast on anything harder than a pillow for 24 hours.  Do not put weight on your injured limb. Do not put pressure on the cast. Wait for your doctor's approval.  Keep the cast or splint dry.  Cover the cast or splint with a plastic bag during baths or wet weather.  If you have a cast over your chest and belly (trunk), take sponge baths until the cast is taken off.  If your cast gets wet, dry it with a towel or blow dryer. Use the cool setting on the blow dryer.  Keep your cast or splint clean. Wash a dirty cast with a damp cloth.  Do not put any objects under your cast or splint.  Do not scratch the skin under the cast with an object. If itching is a problem, use a blow dryer on a cool setting over the itchy area.  Do not trim or cut your cast.  Do not take out the padding from inside your cast.  Exercise your joints near the cast as told by your doctor.  Raise (elevate) your injured limb on 1 or 2 pillows for the first 1 to 3 days. GET HELP IF:  Your cast or splint cracks.  Your cast or splint is too tight or too loose.  You itch badly under the cast.  Your cast gets wet or has a soft spot.  You have a bad smell coming from the cast.  You get an object stuck under the cast.  Your skin around the cast becomes red or sore.  You have new or more pain after the cast is put on. GET HELP RIGHT AWAY IF:  You have fluid leaking through the cast.  You cannot move your fingers or toes.  Your fingers or toes turn blue or white or are cool, painful, or puffy (swollen).  You have tingling or lose feeling (numbness) around the injured area.  You have bad pain or pressure under the  cast.  You have trouble breathing or have shortness of breath.  You have chest pain. Document Released: 09/03/2010 Document Revised: 01/04/2013 Document Reviewed: 11/10/2012 [facility] Patient Information 2015 [place], [place]. This information is not intended to replace advice given to you by your health care provider. Make sure you discuss any questions you have with your health care provider. Fibular Fracture, Ankle, Adult, Undisplaced, Treated With Immobilization Janice Pena, you were seen in the emergency department for a fracture of her ankle. Follow-up with orthopedic surgery within 3 days for continued treatment. Take Norco at home for pain control as needed. Return to the emergency department immediately if you had any worsening pain, numbness, tingling in the foot. Thank you. A simple fracture of the bone below the knee on the outside of your leg (fibula) usually heals without problems. CAUSES Typically, a fibular fracture occurs as a result of trauma. A blow to the side of your leg or a powerful twisting movement can cause a fracture. Fibular fractures are often seen as a result of football, soccer, or skiing injuries. SYMPTOMS Symptoms of a fibular fracture can include:  Pain.  Shortening or abnormal alignment of your lower leg (  angulation). DIAGNOSIS A health care provider will need to examine the leg. X-ray exams will be ordered for further to confirm the fracture and evaluate the extent and of the injury. TREATMENT  Typically, a cast or immobilizer is applied. Sometimes a splint is placed on these fractures if it is needed for comfort or if the bones are badly out of place. Crutches may be needed to help you get around.  HOME CARE INSTRUCTIONS   Apply ice to the injured area:  Put ice in a plastic bag.  Place a towel between your skin and the bag.  Leave the ice on for 20 minutes, 2-3 times a day.  Use crutches as directed. Resume walking without crutches as directed by  your health care provider or when comfortable doing so.  Only take over-the-counter or prescription medicines for pain, discomfort, or fever as directed by your health care provider.  Keeping your leg raised may lessen swelling.  If you have a removable splint or boot, do not remove the boot unless directed by your health care provider.  Do not not drive a car or operate a motor vehicle until your health care provider specifically tells you it is safe to do so. SEEK IMMEDIATE MEDICAL CARE IF:   Your cast gets damaged or breaks.  You have continued severe pain or more swelling than you did before the cast was put on, or the pain is not controlled with medications.  Your skin or nails below the injury turn blue or grey, or feel cold or numb.  There is a bad smell or pus coming from under the cast.  You develop severe pain in ankle or foot. MAKE SURE YOU:   Understand these instructions.  Will watch your condition.  Will get help right away if you are not doing well or get worse. Document Released: 01/24/2002 Document Revised: 02/22/2013 Document Reviewed: 12/14/2012 [facility] Patient Information 2015 [place], [place]. This information is not intended to replace advice given to you by your health care provider. Make sure you discuss any questions you have with your health care provider.

## 2014-03-16 NOTE — ED Notes (Signed)
Ortho at BS

## 2014-03-16 NOTE — ED Notes (Signed)
Patient transported to X-ray 

## 2014-03-16 NOTE — ED Notes (Signed)
Pt given bus ticket.  

## 2014-03-16 NOTE — Progress Notes (Signed)
Orthopedic Tech Progress Note Patient Details:  Janice Pena 22-Nov-1985 [id number]  Ortho Devices Type of Ortho Device: Crutches;Post (short leg) splint;Stirrup splint Ortho Device/Splint Interventions: Application   Haskell Flirt 03/16/2014, 5:58 AM

## 2014-03-19 ENCOUNTER — Encounter (HOSPITAL_COMMUNITY): Payer: Self-pay | Admitting: Emergency Medicine

## 2014-03-28 ENCOUNTER — Encounter: Payer: Medicaid Other | Admitting: Obstetrics & Gynecology

## 2014-03-28 ENCOUNTER — Telehealth: Payer: Self-pay | Admitting: Obstetrics & Gynecology

## 2014-03-28 DIAGNOSIS — N83209 Unspecified ovarian cyst, unspecified side: Secondary | ICD-10-CM

## 2014-03-28 DIAGNOSIS — Z90710 Acquired absence of both cervix and uterus: Secondary | ICD-10-CM

## 2014-03-28 NOTE — Telephone Encounter (Signed)
[facility]ulty [facility] OB/GYN Attending Phone Call Documentation  I placed a call to  Janice Pena who was scheduled to see me today at 1245 in the GYN clinic for follow up of ovarian cyst.  Patient denies any current pain, was not going to be able to make the appointment today and was about to call and cancel.  She was informed that her ultrasound showed a 5 cm simple cyst which did not need further follow up.  She reports that she keeps getting painful cysts and is interested in ovulation suppression.  Discussed hormonal ovulation suppression modalities; she is interested in Nexplanon. Of note, she is s/p hysterectomy for AUB; this is only needed for ovarian cyst suppression. She will call clinic and make appointment for Nexplanon placement. Pain precautions advised.   Jaynie Collins, MD, FACOG Attending Obstetrician & Gynecologist Center for [employer], [facility]

## 2014-05-25 ENCOUNTER — Encounter (HOSPITAL_COMMUNITY): Payer: Self-pay | Admitting: *Deleted

## 2014-05-25 ENCOUNTER — Emergency Department (HOSPITAL_COMMUNITY)
Admission: EM | Admit: 2014-05-25 | Discharge: 2014-05-26 | Disposition: A | Discharge status: Home / Self Care | Payer: Medicaid Other | Attending: Emergency Medicine | Admitting: Emergency Medicine

## 2014-05-25 DIAGNOSIS — J45909 Unspecified asthma, uncomplicated: Secondary | ICD-10-CM | POA: Diagnosis not present

## 2014-05-25 DIAGNOSIS — Z3202 Encounter for pregnancy test, result negative: Secondary | ICD-10-CM | POA: Diagnosis not present

## 2014-05-25 DIAGNOSIS — N8320 Unspecified ovarian cysts: Secondary | ICD-10-CM | POA: Insufficient documentation

## 2014-05-25 DIAGNOSIS — Z720 Tobacco use: Secondary | ICD-10-CM | POA: Insufficient documentation

## 2014-05-25 DIAGNOSIS — F419 Anxiety disorder, unspecified: Secondary | ICD-10-CM | POA: Insufficient documentation

## 2014-05-25 DIAGNOSIS — Z8619 Personal history of other infectious and parasitic diseases: Secondary | ICD-10-CM | POA: Insufficient documentation

## 2014-05-25 DIAGNOSIS — R102 Pelvic and perineal pain: Secondary | ICD-10-CM

## 2014-05-25 DIAGNOSIS — N83202 Unspecified ovarian cyst, left side: Secondary | ICD-10-CM

## 2014-05-25 DIAGNOSIS — R109 Unspecified abdominal pain: Secondary | ICD-10-CM | POA: Diagnosis present

## 2014-05-25 LAB — COMPREHENSIVE METABOLIC PANEL
ALBUMIN: 3.7 g/dL (ref 3.5–5.2)
ALK PHOS: 97 U/L (ref 39–117)
ALT: 10 U/L (ref 0–35)
AST: 16 U/L (ref 0–37)
Anion gap: 4 — ABNORMAL LOW (ref 5–15)
BUN: 6 mg/dL (ref 6–23)
CO2: 28 mmol/L (ref 19–32)
Calcium: 9.8 mg/dL (ref 8.4–10.5)
Chloride: 107 mEq/L (ref 96–112)
Creatinine, Ser: 0.84 mg/dL (ref 0.50–1.10)
GFR calc non Af Amer: >90 mL/min (ref >90)
GLUCOSE: 99 mg/dL (ref 70–99)
POTASSIUM: 3.7 mmol/L (ref 3.5–5.1)
Sodium: 139 mmol/L (ref 135–145)
TOTAL PROTEIN: 6.8 g/dL (ref 6.0–8.3)
Total Bilirubin: 0.4 mg/dL (ref 0.3–1.2)

## 2014-05-25 LAB — CBC WITH DIFFERENTIAL/PLATELET
BASOS ABS: 0 10*3/uL (ref 0.0–0.1)
BASOS PCT: 0 % (ref 0–1)
Eosinophils Absolute: 0.3 10*3/uL (ref 0.0–0.7)
Eosinophils Relative: 3 % (ref 0–5)
HEMATOCRIT: 40.1 % (ref 36.0–46.0)
Hemoglobin: 13.5 g/dL (ref 12.0–15.0)
Lymphocytes Relative: 40 % (ref 12–46)
Lymphs Abs: 3.7 10*3/uL (ref 0.7–4.0)
MCH: 27.6 pg (ref 26.0–34.0)
MCHC: 33.7 g/dL (ref 30.0–36.0)
MCV: 81.8 fL (ref 78.0–100.0)
MONOS PCT: 5 % (ref 3–12)
Monocytes Absolute: 0.5 10*3/uL (ref 0.1–1.0)
NEUTROS ABS: 4.8 10*3/uL (ref 1.7–7.7)
NEUTROS PCT: 52 % (ref 43–77)
Platelets: 328 10*3/uL (ref 150–400)
RBC: 4.9 MIL/uL (ref 3.87–5.11)
RDW: 13.3 % (ref 11.5–15.5)
WBC: 9.3 10*3/uL (ref 4.0–10.5)

## 2014-05-25 LAB — LIPASE, BLOOD: LIPASE: 48 U/L (ref 11–59)

## 2014-05-25 NOTE — ED Notes (Signed)
Pt in c/o lower abd pain since this morning, history of ovarian cysts and states this feels the same, reports nausea, no vomiting, no distress noted

## 2014-05-26 LAB — URINALYSIS, ROUTINE W REFLEX MICROSCOPIC
Bilirubin Urine: NEGATIVE
Glucose, UA: NEGATIVE mg/dL
Hgb urine dipstick: NEGATIVE
Ketones, ur: 15 mg/dL — AB
Leukocytes, UA: NEGATIVE
Nitrite: NEGATIVE
PH: 5.5 (ref 5.0–8.0)
Protein, ur: NEGATIVE mg/dL
Specific Gravity, Urine: 1.025 (ref 1.005–1.030)
Urobilinogen, UA: 1 mg/dL (ref 0.0–1.0)

## 2014-05-26 LAB — PREGNANCY, URINE: Preg Test, Ur: NEGATIVE

## 2014-05-26 MED ORDER — TRAMADOL HCL 50 MG PO TABS: 50.0000 mg | ORAL_TABLET | Freq: Four times a day (QID) | ORAL | Status: DC | PRN

## 2014-05-26 MED ORDER — MORPHINE SULFATE 4 MG/ML IJ SOLN
6.0000 mg | Freq: Once | INTRAMUSCULAR | Status: AC
  Administered 2014-05-26: 6 mg via INTRAMUSCULAR
  Filled 2014-05-26: qty 2

## 2014-05-26 MED ORDER — KETOROLAC TROMETHAMINE 60 MG/2ML IM SOLN
60.0000 mg | Freq: Once | INTRAMUSCULAR | Status: AC
  Administered 2014-05-26: 60 mg via INTRAMUSCULAR
  Filled 2014-05-26: qty 2

## 2014-05-26 MED ORDER — NAPROXEN 500 MG PO TABS: 500.0000 mg | ORAL_TABLET | Freq: Two times a day (BID) | ORAL | Status: DC | PRN

## 2014-05-26 NOTE — ED Provider Notes (Signed)
CSN: [id number]     Arrival date & time 05/25/14  2235 History  This chart was scribed for Enid Skeens, MD by Modena Jansky, ED Scribe. This patient was seen in room D35C/D35C and the patient's care was started at 12:49 AM.   Chief Complaint  Patient presents with  . Abdominal Pain   The history is provided by the patient. No language interpreter was used.   HPI Comments: HAN LYSNE is a 29 y.o. female with a hx of ovarian cysts and partial hysterectomy who presents to the Emergency Department complaining of constant moderate lower abdominal pain that started this morning. She reports that the pain got worse overnight, and keeps getting worse with nausea. She reports that she still has her left ovary. She states that she has a hx of menstrual problems. She reports no new partners. She denies any vomting, blood in stool, vaginal bleeding or discharge.   Past Medical History  Diagnosis Date  . Asthma   . Anxiety   . HSV infection   . Bilateral ovarian cysts    Past Surgical History  Procedure Laterality Date  . Abdominal hysterectomy     History reviewed. No pertinent family history. History  Substance Use Topics  . Smoking status: Current Every Day Smoker -- 1.00 packs/day    Types: Cigarettes  . Smokeless tobacco: Not on file  . Alcohol Use: No   OB History    Gravida Para Term Preterm AB TAB SAB Ectopic Multiple Living   2 2        2      Review of Systems  Gastrointestinal: Positive for nausea and abdominal pain. Negative for vomiting and blood in stool.  Genitourinary: Negative for vaginal bleeding and vaginal discharge.  All other systems reviewed and are negative.   Allergies  Lactose intolerance (gi); Other; and Oxycontin  Home Medications   Prior to Admission medications   Medication Sig Start Date End Date Taking? Authorizing Provider  albuterol (PROVENTIL HFA;VENTOLIN HFA) 108 (90 BASE) MCG/ACT inhaler Inhale 1-2 puffs into the lungs every 6 (six)  hours as needed for wheezing or shortness of breath (shortness of breath).    Historical Provider, MD  Fluticasone-Salmeterol (ADVAIR) 500-50 MCG/DOSE AEPB Inhale 1 puff into the lungs 2 (two) times daily as needed (shortness of breath).     Historical Provider, MD  HYDROcodone-acetaminophen (NORCO/VICODIN) 5-325 MG per tablet Take 1 tablet by mouth 2 (two) times daily as needed for severe pain. 03/16/14   03/18/14, MD  ibuprofen (ADVIL,MOTRIN) 200 MG tablet Take 400 mg by mouth every 6 (six) hours as needed for moderate pain.    Historical Provider, MD  naproxen (NAPROSYN) 500 MG tablet Take 1 tablet (500 mg total) by mouth 2 (two) times daily as needed. 05/26/14   07/25/14, MD  traMADol (ULTRAM) 50 MG tablet Take 1 tablet (50 mg total) by mouth every 6 (six) hours as needed. 05/26/14   07/25/14, MD   BP 123/83 mmHg  Pulse 69  Temp(Src) 98.3 F (36.8 C) (Oral)  Resp 20  Ht 5\' 5"  (1.651 m)  Wt 145 lb (65.772 kg)  BMI 24.13 kg/m2  SpO2 100% Physical Exam  Constitutional: She is oriented to person, place, and time. She appears well-developed and well-nourished. No distress.  HENT:  Head: Normocephalic and atraumatic.  Neck: Neck supple. No tracheal deviation present.  Cardiovascular: Regular rhythm.   Pulmonary/Chest: Effort normal. No respiratory distress.  Abdominal: Soft. There is  tenderness. There is no guarding.  Lower pelvic TTP.   Musculoskeletal: Normal range of motion.  Neurological: She is alert and oriented to person, place, and time.  Skin: Skin is warm and dry.  Psychiatric: She has a normal mood and affect. Her behavior is normal.  Nursing note and vitals reviewed.   ED Course  Procedures (including critical care time) DIAGNOSTIC STUDIES: Oxygen Saturation is 100% on RA, normal by my interpretation.    COORDINATION OF CARE: 12:53 AM- Pt advised of plan for treatment which includes medication and labs and pt agrees.  Labs Review Labs Reviewed   COMPREHENSIVE METABOLIC PANEL - Abnormal; Notable for the following:    Anion gap 4 (*)    All other components within normal limits  URINALYSIS, ROUTINE W REFLEX MICROSCOPIC - Abnormal; Notable for the following:    APPearance CLOUDY (*)    Ketones, ur 15 (*)    All other components within normal limits  CBC WITH DIFFERENTIAL  LIPASE, BLOOD  PREGNANCY, URINE    Imaging Review No results found.   EKG Interpretation None      MDM   Final diagnoses:  Pelvic pain in female  Cyst of left ovary    I personally performed the services described in this documentation, which was scribed in my presence. The recorded information has been reviewed and is accurate.  Patient presents with similar left lower pelvic pain his previous ovarian cyst. Patient denies vaginal symptoms or new central partners. Patient wishes to hold on pelvic exam at this time as similar previous and patient has outpatient follow-up for pelvic exam. Patient's pain treated in the ER. Blood work reviewed unremarkable. No indication for emergent imaging at this time.  Results and differential diagnosis were discussed with the patient/parent/guardian. Close follow up outpatient was discussed, comfortable with the plan.   Medications  ketorolac (TORADOL) injection 60 mg (60 mg Intramuscular Given 05/26/14 0106)  morphine 4 MG/ML injection 6 mg (6 mg Intramuscular Given 05/26/14 0106)    Filed Vitals:   05/26/14 0015 05/26/14 0100 05/26/14 0130 05/26/14 0140  BP: 123/83 116/72 114/72 114/72  Pulse: 69 53 49 51  Temp:    98.1 F (36.7 C)  TempSrc:    Oral  Resp: 20   16  Height:      Weight:      SpO2: 100% 100% 100% 100%    Final diagnoses:  Pelvic pain in female  Cyst of left ovary      Enid Skeens, MD 05/26/14 [phone]

## 2014-05-26 NOTE — Discharge Instructions (Signed)
If you were given medicines take as directed.  If you are on coumadin or contraceptives realize their levels and effectiveness is altered by many different medicines.  If you have any reaction (rash, tongues swelling, other) to the medicines stop taking and see a physician.   Please follow up as directed and return to the ER or see a physician for new or worsening symptoms.  Thank you. Filed Vitals:   05/25/14 2239 05/26/14 0015  BP: 133/93 123/83  Pulse: 105 69  Temp: 98.3 F (36.8 C)   TempSrc: Oral   Resp: 18 20  Height: 5\' 5"  (1.651 m)   Weight: 145 lb (65.772 kg)   SpO2: 97% 100%

## 2014-06-30 ENCOUNTER — Emergency Department (HOSPITAL_COMMUNITY)
Admission: EM | Admit: 2014-06-30 | Discharge: 2014-06-30 | Disposition: A | Discharge status: Home / Self Care | Payer: Medicaid Other | Attending: Emergency Medicine | Admitting: Emergency Medicine

## 2014-06-30 ENCOUNTER — Encounter (HOSPITAL_COMMUNITY): Payer: Self-pay | Admitting: *Deleted

## 2014-06-30 DIAGNOSIS — F419 Anxiety disorder, unspecified: Secondary | ICD-10-CM | POA: Diagnosis not present

## 2014-06-30 DIAGNOSIS — R202 Paresthesia of skin: Secondary | ICD-10-CM

## 2014-06-30 DIAGNOSIS — R51 Headache: Secondary | ICD-10-CM | POA: Diagnosis present

## 2014-06-30 MED ORDER — ALPRAZOLAM 0.25 MG PO TABS
0.2500 mg | ORAL_TABLET | Freq: Once | ORAL | Status: AC
  Administered 2014-06-30: 0.25 mg via ORAL
  Filled 2014-06-30: qty 1

## 2014-06-30 MED ORDER — ALPRAZOLAM 0.25 MG PO TABS: 0.2500 mg | ORAL_TABLET | Freq: Three times a day (TID) | ORAL | Status: DC | PRN

## 2014-06-30 NOTE — ED Notes (Signed)
Patient presents with c/o headache, tingling in legs, hands and face for 2-3 days.   Stopped Chantix 2-3 days ago due to her neck tingling.  Per [facility] stroke screen was negative.  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  Patient "just want to get checked out"

## 2014-06-30 NOTE — ED Provider Notes (Signed)
CSN: [id number]     Arrival date & time 06/30/14  1937 History   First MD Initiated Contact with Patient 06/30/14 2028     Chief Complaint  Patient presents with  . Headache     (Consider location/radiation/quality/duration/timing/severity/associated sxs/prior Treatment) HPI Comments: Patient here complaining of 3 days of whole-body tingling as well as increased anxiety. Patient recently stopped taking gabapentin as well as Chantix. Denies any focal weakness. Mild headache noted without fever, vomiting, neck pain. Denies any abdominal or chest pain. Nothing makes her symptoms better or worse. No treatment used prior to arrival. [facility] and was transported here.  Patient is a 29 y.o. female presenting with headaches. The history is provided by the patient.  Headache   Past Medical History  Diagnosis Date  . Asthma   . Anxiety   . HSV infection   . Bilateral ovarian cysts    Past Surgical History  Procedure Laterality Date  . Abdominal hysterectomy     No family history on file. History  Substance Use Topics  . Smoking status: Former Smoker -- 1.00 packs/day    Types: Cigarettes    Quit date: 06/03/2014  . Smokeless tobacco: Not on file  . Alcohol Use: No   OB History    Gravida Para Term Preterm AB TAB SAB Ectopic Multiple Living   2 2        2      Review of Systems  Neurological: Positive for headaches.  All other systems reviewed and are negative.     Allergies  Lactose intolerance (gi); Other; and Oxycontin  Home Medications   Prior to Admission medications   Medication Sig Start Date End Date Taking? Authorizing Provider  albuterol (PROVENTIL HFA;VENTOLIN HFA) 108 (90 BASE) MCG/ACT inhaler Inhale 1-2 puffs into the lungs every 6 (six) hours as needed for wheezing or shortness of breath (shortness of breath).    Historical Provider, MD  Fluticasone-Salmeterol (ADVAIR) 500-50 MCG/DOSE AEPB Inhale 1 puff into the lungs 2 (two) times daily as needed  (shortness of breath).     Historical Provider, MD  HYDROcodone-acetaminophen (NORCO/VICODIN) 5-325 MG per tablet Take 1 tablet by mouth 2 (two) times daily as needed for severe pain. 03/16/14   03/18/14, MD  ibuprofen (ADVIL,MOTRIN) 200 MG tablet Take 400 mg by mouth every 6 (six) hours as needed for moderate pain.    Historical Provider, MD  naproxen (NAPROSYN) 500 MG tablet Take 1 tablet (500 mg total) by mouth 2 (two) times daily as needed. 05/26/14   07/25/14, MD  traMADol (ULTRAM) 50 MG tablet Take 1 tablet (50 mg total) by mouth every 6 (six) hours as needed. 05/26/14   07/25/14, MD   BP 137/75 mmHg  Pulse 63  Temp(Src) 98.2 F (36.8 C) (Oral)  Resp 18  SpO2 98% Physical Exam  Constitutional: She is oriented to person, place, and time. She appears well-developed and well-nourished.  Non-toxic appearance. No distress.  HENT:  Head: Normocephalic and atraumatic.  Eyes: Conjunctivae, EOM and lids are normal. Pupils are equal, round, and reactive to light.  Neck: Normal range of motion. Neck supple. No tracheal deviation present. No thyroid mass present.  Cardiovascular: Normal rate, regular rhythm and normal heart sounds.  Exam reveals no gallop.   No murmur heard. Pulmonary/Chest: Effort normal and breath sounds normal. No stridor. No respiratory distress. She has no decreased breath sounds. She has no wheezes. She has no rhonchi. She has no rales.  Abdominal:  Soft. Normal appearance and bowel sounds are normal. She exhibits no distension. There is no tenderness. There is no rebound and no CVA tenderness.  Musculoskeletal: Normal range of motion. She exhibits no edema or tenderness.  Neurological: She is alert and oriented to person, place, and time. She has normal strength. No cranial nerve deficit or sensory deficit. GCS eye subscore is 4. GCS verbal subscore is 5. GCS motor subscore is 6.  Skin: Skin is warm and dry. No abrasion and no rash noted.  Psychiatric: She has a  normal mood and affect. Her speech is normal and behavior is normal.  Nursing note and vitals reviewed.   ED Course  Procedures (including critical care time) Labs Review Labs Reviewed - No data to display  Imaging Review No results found.   EKG Interpretation None      MDM   Final diagnoses:  None    Patient given Xanax here for likely anxiety and instructed to follow-up with her Dr.    Toy Baker, MD 06/30/14 2042

## 2014-06-30 NOTE — Discharge Instructions (Signed)
Follow-up with your Dr. next week

## 2014-08-15 ENCOUNTER — Encounter: Payer: Self-pay | Admitting: Diagnostic Neuroimaging

## 2014-08-15 ENCOUNTER — Ambulatory Visit (INDEPENDENT_AMBULATORY_CARE_PROVIDER_SITE_OTHER): Payer: Medicaid Other | Admitting: Diagnostic Neuroimaging

## 2014-08-15 VITALS — BP 126/89 | HR 84 | Temp 97.0°F | Resp 16 | Ht 64.0 in | Wt 160.0 lb

## 2014-08-15 DIAGNOSIS — M25532 Pain in left wrist: Secondary | ICD-10-CM

## 2014-08-15 DIAGNOSIS — R200 Anesthesia of skin: Secondary | ICD-10-CM

## 2014-08-15 DIAGNOSIS — R208 Other disturbances of skin sensation: Secondary | ICD-10-CM | POA: Diagnosis not present

## 2014-08-15 NOTE — Patient Instructions (Signed)
I will check EMG/NCS (electrical nerve test).  Then we may check MRI and EEG if needed.
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
## 2014-08-22 ENCOUNTER — Encounter (INDEPENDENT_AMBULATORY_CARE_PROVIDER_SITE_OTHER): Payer: Self-pay | Admitting: Diagnostic Neuroimaging

## 2014-08-22 ENCOUNTER — Ambulatory Visit (INDEPENDENT_AMBULATORY_CARE_PROVIDER_SITE_OTHER): Payer: Medicaid Other | Admitting: Diagnostic Neuroimaging

## 2014-08-22 DIAGNOSIS — M25532 Pain in left wrist: Secondary | ICD-10-CM | POA: Diagnosis not present

## 2014-08-22 DIAGNOSIS — Z0289 Encounter for other administrative examinations: Secondary | ICD-10-CM

## 2014-08-22 DIAGNOSIS — R200 Anesthesia of skin: Secondary | ICD-10-CM

## 2014-08-22 NOTE — Procedures (Signed)
   [facility]  NCS (NERVE CONDUCTION STUDY) WITH EMG (ELECTROMYOGRAPHY) REPORT   STUDY DATE: 08/22/14 PATIENT NAME: Janice Pena DOB: 07-03-1985 MRN: [id number]  ORDERING CLINICIAN: Joycelyn Schmid, MD   TECHNOLOGIST: Gearldine Shown  ELECTROMYOGRAPHER: Glenford Bayley. Rosalita Carey, MD  CLINICAL INFORMATION: 29 year old female with left arm / hand numbness.  FINDINGS: NERVE CONDUCTION STUDY: Bilateral median and ulnar motor responses and F wave latencies are normal. Bilateral median and ulnar sensory responses are normal.  NEEDLE ELECTROMYOGRAPHY: Needle examination of left upper extremity deltoid, biceps, triceps, flexor carpi radialis, first dorsal interosseous muscles is normal.  IMPRESSION:  This is a normal study. No electrodiagnostic evidence of large fiber neuropathy at this time.   INTERPRETING PHYSICIAN:  Suanne Marker, MD Certified in Neurology, Neurophysiology and Neuroimaging  [facility] [address], [address] [place], [place] [address] [phone]

## 2014-08-28 ENCOUNTER — Telehealth: Payer: Self-pay | Admitting: Diagnostic Neuroimaging

## 2014-08-28 ENCOUNTER — Other Ambulatory Visit: Payer: Medicaid Other

## 2014-08-28 NOTE — Telephone Encounter (Signed)
medicaid did not approve case must have 6 week physician guided treatmeat before will approve.  Do you want to appeal decision?  [phone]  #[id number] t

## 2014-08-28 NOTE — Telephone Encounter (Signed)
She has left facial pain in addition to left wrist pain. The face pain should qualify for MRI brain. -VRP

## 2014-08-28 NOTE — Telephone Encounter (Signed)
I tried to give information to nurse review an she said only a Publishing rights manager or MD can appeal.  Please call.

## 2014-08-29 ENCOUNTER — Other Ambulatory Visit: Payer: Self-pay

## 2014-08-29 ENCOUNTER — Encounter: Payer: Self-pay | Admitting: Diagnostic Neuroimaging

## 2014-08-30 ENCOUNTER — Other Ambulatory Visit (HOSPITAL_COMMUNITY): Payer: Self-pay | Admitting: Obstetrics

## 2014-08-30 DIAGNOSIS — N83209 Unspecified ovarian cyst, unspecified side: Secondary | ICD-10-CM

## 2014-09-04 NOTE — Telephone Encounter (Signed)
MRI denied in peer to peer with dr Marjory Lies

## 2014-09-05 ENCOUNTER — Emergency Department (HOSPITAL_COMMUNITY)
Admission: EM | Admit: 2014-09-05 | Discharge: 2014-09-06 | Disposition: A | Discharge status: Home / Self Care | Payer: Medicaid Other | Attending: Emergency Medicine | Admitting: Emergency Medicine

## 2014-09-05 ENCOUNTER — Encounter (HOSPITAL_COMMUNITY): Payer: Self-pay | Admitting: Emergency Medicine

## 2014-09-05 DIAGNOSIS — Z79899 Other long term (current) drug therapy: Secondary | ICD-10-CM | POA: Diagnosis not present

## 2014-09-05 DIAGNOSIS — R1030 Lower abdominal pain, unspecified: Secondary | ICD-10-CM | POA: Diagnosis present

## 2014-09-05 DIAGNOSIS — R110 Nausea: Secondary | ICD-10-CM | POA: Insufficient documentation

## 2014-09-05 DIAGNOSIS — R0981 Nasal congestion: Secondary | ICD-10-CM | POA: Diagnosis not present

## 2014-09-05 DIAGNOSIS — J45909 Unspecified asthma, uncomplicated: Secondary | ICD-10-CM | POA: Insufficient documentation

## 2014-09-05 DIAGNOSIS — Z8742 Personal history of other diseases of the female genital tract: Secondary | ICD-10-CM | POA: Diagnosis not present

## 2014-09-05 DIAGNOSIS — Z720 Tobacco use: Secondary | ICD-10-CM | POA: Diagnosis not present

## 2014-09-05 DIAGNOSIS — R1032 Left lower quadrant pain: Secondary | ICD-10-CM | POA: Insufficient documentation

## 2014-09-05 DIAGNOSIS — F419 Anxiety disorder, unspecified: Secondary | ICD-10-CM | POA: Diagnosis not present

## 2014-09-05 DIAGNOSIS — Z8639 Personal history of other endocrine, nutritional and metabolic disease: Secondary | ICD-10-CM | POA: Insufficient documentation

## 2014-09-05 DIAGNOSIS — R05 Cough: Secondary | ICD-10-CM | POA: Insufficient documentation

## 2014-09-05 DIAGNOSIS — G8929 Other chronic pain: Secondary | ICD-10-CM

## 2014-09-05 DIAGNOSIS — Z8619 Personal history of other infectious and parasitic diseases: Secondary | ICD-10-CM | POA: Diagnosis not present

## 2014-09-05 DIAGNOSIS — Z8719 Personal history of other diseases of the digestive system: Secondary | ICD-10-CM | POA: Insufficient documentation

## 2014-09-05 DIAGNOSIS — R1031 Right lower quadrant pain: Secondary | ICD-10-CM

## 2014-09-05 LAB — COMPREHENSIVE METABOLIC PANEL
ALBUMIN: 3.5 g/dL (ref 3.5–5.2)
ALT: 10 U/L (ref 0–35)
AST: 17 U/L (ref 0–37)
Alkaline Phosphatase: 85 U/L (ref 39–117)
Anion gap: 7 (ref 5–15)
CALCIUM: 9.1 mg/dL (ref 8.4–10.5)
CO2: 27 mmol/L (ref 19–32)
Chloride: 105 mmol/L (ref 96–112)
Creatinine, Ser: 0.88 mg/dL (ref 0.50–1.10)
GFR calc non Af Amer: 87 mL/min — ABNORMAL LOW (ref >90)
GLUCOSE: 86 mg/dL (ref 70–99)
Potassium: 3.6 mmol/L (ref 3.5–5.1)
SODIUM: 139 mmol/L (ref 135–145)
TOTAL PROTEIN: 6.2 g/dL (ref 6.0–8.3)
Total Bilirubin: 0.4 mg/dL (ref 0.3–1.2)

## 2014-09-05 LAB — URINALYSIS, ROUTINE W REFLEX MICROSCOPIC
Bilirubin Urine: NEGATIVE
Glucose, UA: NEGATIVE mg/dL
HGB URINE DIPSTICK: NEGATIVE
Ketones, ur: NEGATIVE mg/dL
Leukocytes, UA: NEGATIVE
NITRITE: NEGATIVE
PH: 5 (ref 5.0–8.0)
Protein, ur: NEGATIVE mg/dL
SPECIFIC GRAVITY, URINE: 1.024 (ref 1.005–1.030)
Urobilinogen, UA: 0.2 mg/dL (ref 0.0–1.0)

## 2014-09-05 LAB — CBC WITH DIFFERENTIAL/PLATELET
BASOS ABS: 0 10*3/uL (ref 0.0–0.1)
BASOS PCT: 0 % (ref 0–1)
EOS PCT: 4 % (ref 0–5)
Eosinophils Absolute: 0.3 10*3/uL (ref 0.0–0.7)
HCT: 41.3 % (ref 36.0–46.0)
Hemoglobin: 14.2 g/dL (ref 12.0–15.0)
Lymphocytes Relative: 47 % — ABNORMAL HIGH (ref 12–46)
Lymphs Abs: 3.6 10*3/uL (ref 0.7–4.0)
MCH: 28.4 pg (ref 26.0–34.0)
MCHC: 34.4 g/dL (ref 30.0–36.0)
MCV: 82.6 fL (ref 78.0–100.0)
MONO ABS: 0.3 10*3/uL (ref 0.1–1.0)
Monocytes Relative: 4 % (ref 3–12)
NEUTROS ABS: 3.6 10*3/uL (ref 1.7–7.7)
NEUTROS PCT: 45 % (ref 43–77)
PLATELETS: 321 10*3/uL (ref 150–400)
RBC: 5 MIL/uL (ref 3.87–5.11)
RDW: 13.4 % (ref 11.5–15.5)
WBC: 7.9 10*3/uL (ref 4.0–10.5)

## 2014-09-05 LAB — LIPASE, BLOOD: Lipase: 34 U/L (ref 11–59)

## 2014-09-05 MED ORDER — HYDROCODONE-ACETAMINOPHEN 5-325 MG PO TABS
2.0000 | ORAL_TABLET | Freq: Once | ORAL | Status: AC
  Administered 2014-09-05: 2 via ORAL
  Filled 2014-09-05: qty 2

## 2014-09-05 NOTE — Discharge Instructions (Signed)
Abdominal Pain, Women °Abdominal (stomach, pelvic, or belly) pain can be caused by many things. It is important to tell your doctor: °· The location of the pain. °· Does it come and go or is it present all the time? °· Are there things that start the pain (eating certain foods, exercise)? °· Are there other symptoms associated with the pain (fever, nausea, vomiting, diarrhea)? °All of this is helpful to know when trying to find the cause of the pain. °CAUSES  °· Stomach: virus or bacteria infection, or ulcer. °· Intestine: appendicitis (inflamed appendix), regional ileitis (Crohn's disease), ulcerative colitis (inflamed colon), irritable bowel syndrome, diverticulitis (inflamed diverticulum of the colon), or cancer of the stomach or intestine. °· Gallbladder disease or stones in the gallbladder. °· Kidney disease, kidney stones, or infection. °· Pancreas infection or cancer. °· Fibromyalgia (pain disorder). °· Diseases of the female organs: °¨ Uterus: fibroid (non-cancerous) tumors or infection. °¨ Fallopian tubes: infection or tubal pregnancy. °¨ Ovary: cysts or tumors. °¨ Pelvic adhesions (scar tissue). °¨ Endometriosis (uterus lining tissue growing in the pelvis and on the pelvic organs). °¨ Pelvic congestion syndrome (female organs filling up with blood just before the menstrual period). °¨ Pain with the menstrual period. °¨ Pain with ovulation (producing an egg). °¨ Pain with an IUD (intrauterine device, birth control) in the uterus. °¨ Cancer of the female organs. °· Functional pain (pain not caused by a disease, may improve without treatment). °· Psychological pain. °· Depression. °DIAGNOSIS  °Your doctor will decide the seriousness of your pain by doing an examination. °· Blood tests. °· X-rays. °· Ultrasound. °· CT scan (computed tomography, special type of X-ray). °· MRI (magnetic resonance imaging). °· Cultures, for infection. °· Barium enema (dye inserted in the large intestine, to better view it with  X-rays). °· Colonoscopy (looking in intestine with a lighted tube). °· Laparoscopy (minor surgery, looking in abdomen with a lighted tube). °· Major abdominal exploratory surgery (looking in abdomen with a large incision). °TREATMENT  °The treatment will depend on the cause of the pain.  °· Many cases can be observed and treated at home. °· Over-the-counter medicines recommended by your caregiver. °· Prescription medicine. °· Antibiotics, for infection. °· Birth control pills, for painful periods or for ovulation pain. °· Hormone treatment, for endometriosis. °· Nerve blocking injections. °· Physical therapy. °· Antidepressants. °· Counseling with a psychologist or psychiatrist. °· Minor or major surgery. °HOME CARE INSTRUCTIONS  °· Do not take laxatives, unless directed by your caregiver. °· Take over-the-counter pain medicine only if ordered by your caregiver. Do not take aspirin because it can cause an upset stomach or bleeding. °· Try a clear liquid diet (broth or water) as ordered by your caregiver. Slowly move to a bland diet, as tolerated, if the pain is related to the stomach or intestine. °· Have a thermometer and take your temperature several times a day, and record it. °· Bed rest and sleep, if it helps the pain. °· Avoid sexual intercourse, if it causes pain. °· Avoid stressful situations. °· Keep your follow-up appointments and tests, as your caregiver orders. °· If the pain does not go away with medicine or surgery, you may try: °¨ Acupuncture. °¨ Relaxation exercises (yoga, meditation). °¨ Group therapy. °¨ Counseling. °SEEK MEDICAL CARE IF:  °· You notice certain foods cause stomach pain. °· Your home care treatment is not helping your pain. °· You need stronger pain medicine. °· You want your IUD removed. °· You feel faint or   lightheaded. °· You develop nausea and vomiting. °· You develop a rash. °· You are having side effects or an allergy to your medicine. °SEEK IMMEDIATE MEDICAL CARE IF:  °· Your  pain does not go away or gets worse. °· You have a fever. °· Your pain is felt only in portions of the abdomen. The right side could possibly be appendicitis. The left lower portion of the abdomen could be colitis or diverticulitis. °· You are passing blood in your stools (bright red or black tarry stools, with or without vomiting). °· You have blood in your urine. °· You develop chills, with or without a fever. °· You pass out. °MAKE SURE YOU:  °· Understand these instructions. °· Will watch your condition. °· Will get help right away if you are not doing well or get worse. °Document Released: 03/01/2007 Document Revised: 09/18/2013 Document Reviewed: 03/21/2009 °Exit[employer]® Patient Information ©2015 [employer]. This information is not intended to replace advice given to you by your health care provider. Make sure you discuss any questions you have with your health care provider. ° °

## 2014-09-05 NOTE — ED Notes (Signed)
Pt states that she saw her PCP yesterday for dizziness and a refill of her narcotic pain reliever. Pt states that her PCP did not want to refill her prescription, and instead prescribed her an anti-inflammatory medication. Pt states that she did not get the prescription filled because she states that it will not help her pain. Pt attempted to get another appointment with her PCP today, but was told that she would not be able to get an appointment this week, but the nurse at her PCP's office told her to go to Urgent Care or the hospital. Pt states that she was diagnosed with a 3cm left ovarian cyst.

## 2014-09-05 NOTE — ED Provider Notes (Signed)
CSN: [id number]     Arrival date & time 09/05/14  2117 History  This chart was scribed for Mirian Mo, MD by Tanda Rockers, ED Scribe. This patient was seen in room D36C/D36C and the patient's care was started at 11:15 PM.    Chief Complaint  Patient presents with  . Abdominal Pain   The history is provided by the patient. No language interpreter was used.     HPI Comments: DAYELIN BALDUCCI is a 29 y.o. female with hx ovarian cysts who presents to the Emergency Department complaining of constant lower abdominal pain that began 1 day ago. She states that the pain is exacerbated with movement, eating, and drinking. Pt went to PCP, Dr. Julio Sicks, yesterday for refill of Vicoprofen that she gets from the PA at the office. She states that her PCP prescribed her anti-inflammatory pain medication instead which has not been relieving her symptoms. She attempted to go back to PCP today but could not get an appointment. The nurse as PCP's office told pt to come to the ED or urgent care if her pain persisted. She admits to taking a Vicoprofen earlier today as well as 100 mg Lyrica. Pt states that she was feeling dizzy before taking medicine but that it worsened afterwards. She states that she also began "twitching" which concerned her, however this has now resolved. She also complains of nausea, cough, and congestion. She attributes the cough to her asthma. She denies vomiting, fever, sore throat, dysuria, change in vaginal discharge, diarrhea, or any other symptoms. Pt mentions that she usually has the abdominal pain once a month and admits that she would not have come to the ED tonight if she had pain medication.    Past Medical History  Diagnosis Date  . Asthma   . Anxiety   . HSV infection   . Bilateral ovarian cysts   . Left wrist pain   . Asthma   . Anxiety   . GERD (gastroesophageal reflux disease)   . Vitamin D deficiency    Past Surgical History  Procedure Laterality Date  . Abdominal  hysterectomy     Family History  Problem Relation Age of Onset  . Hypertension Mother   . Hyperlipidemia Mother   . Asthma Mother   . Hypertension Father   . Hyperlipidemia Father   . Asthma Father    History  Substance Use Topics  . Smoking status: Current Every Day Smoker -- 1.00 packs/day    Types: Cigarettes    Last Attempt to Quit: 06/03/2014  . Smokeless tobacco: Not on file     Comment: 5 or less a day   . Alcohol Use: No   OB History    Gravida Para Term Preterm AB TAB SAB Ectopic Multiple Living   2 2        2      Review of Systems  Constitutional: Negative for fever.  HENT: Positive for congestion. Negative for sore throat.   Respiratory: Positive for cough.   Gastrointestinal: Positive for nausea and abdominal pain. Negative for vomiting and diarrhea.  Genitourinary: Positive for vaginal discharge (Baseline for patient. ). Negative for dysuria.  Neurological: Positive for dizziness.  All other systems reviewed and are negative.     Allergies  Lactose intolerance (gi); Other; and Oxycontin  Home Medications   Prior to Admission medications   Medication Sig Start Date End Date Taking? Authorizing Provider  ADVAIR DISKUS 250-50 MCG/DOSE AEPB Inhale into the lungs 2 (two) times daily.  07/28/14  Yes Historical Provider, MD  albuterol (PROVENTIL HFA;VENTOLIN HFA) 108 (90 BASE) MCG/ACT inhaler Inhale 1-2 puffs into the lungs every 6 (six) hours as needed for wheezing or shortness of breath (shortness of breath).   Yes Historical Provider, MD  LORazepam (ATIVAN) 1 MG tablet Take 1 mg by mouth every 8 (eight) hours as needed for anxiety or sleep.    Yes Historical Provider, MD   Triage Vitals: BP 122/86 mmHg  Pulse 70  Temp(Src) 98.2 F (36.8 C) (Oral)  Resp 14  Ht 5\' 5"  (1.651 m)  Wt 156 lb (70.761 kg)  BMI 25.96 kg/m2  SpO2 99%   Physical Exam  Constitutional: She is oriented to person, place, and time. She appears well-developed and well-nourished.   HENT:  Head: Normocephalic and atraumatic.  Right Ear: External ear normal.  Left Ear: External ear normal.  Eyes: Conjunctivae and EOM are normal. Pupils are equal, round, and reactive to light.  Neck: Normal range of motion. Neck supple.  Cardiovascular: Normal rate, regular rhythm, normal heart sounds and intact distal pulses.   Pulmonary/Chest: Effort normal and breath sounds normal.  Abdominal: Soft. Bowel sounds are normal. There is tenderness in the left lower quadrant.  Musculoskeletal: Normal range of motion.  Neurological: She is alert and oriented to person, place, and time.  Skin: Skin is warm and dry.  Vitals reviewed.   ED Course  Procedures (including critical care time)  DIAGNOSTIC STUDIES: Oxygen Saturation is 99% on RA, normal by my interpretation.    COORDINATION OF CARE: 11:23 PM-Discussed treatment plan which includes pain medication in ED with pt at bedside and pt agreed to plan.   Labs Review Labs Reviewed  CBC WITH DIFFERENTIAL/PLATELET - Abnormal; Notable for the following:    Lymphocytes Relative 47 (*)    All other components within normal limits  COMPREHENSIVE METABOLIC PANEL - Abnormal; Notable for the following:    BUN <5 (*)    GFR calc non Af Amer 87 (*)    All other components within normal limits  URINALYSIS, ROUTINE W REFLEX MICROSCOPIC - Abnormal; Notable for the following:    APPearance CLOUDY (*)    All other components within normal limits  LIPASE, BLOOD    Imaging Review No results found.   EKG Interpretation None      MDM   Final diagnoses:  Abdominal pain, chronic, bilateral lower quadrant    29 y.o. female with pertinent PMH of asthma, anxiety presents with chronic abd pain.  Pt ran out of pain medication yesterday, states she would not be here today if she had home pain medication.  She also complains of some shaking after lyrica, however this has now resolved.  Physical exam on arrival as above.  No diarrhea or  constipation.  Likely chronic abd pain, no indication for pelvic or upreg as pt is sp hysterectomy.  DC home in stable condition.    I have reviewed all laboratory and imaging studies if ordered as above  1. Abdominal pain, chronic, bilateral lower quadrant           26, MD 09/05/14 2349

## 2014-09-05 NOTE — ED Notes (Signed)
Pt. reports LLQ pain onset yesterday , denies nausea or vomitting / no diarrhea or fever . Pt. seen by her PCP yesterday but will not give her narcotic pain medication. Pt. states history of chronic pain due to her ovarian cyst.

## 2014-09-07 ENCOUNTER — Ambulatory Visit (HOSPITAL_COMMUNITY): Payer: Medicaid Other

## 2014-09-11 ENCOUNTER — Ambulatory Visit (HOSPITAL_COMMUNITY): Admission: RE | Admit: 2014-09-11 | Payer: Medicaid Other | Source: Ambulatory Visit

## 2014-09-14 ENCOUNTER — Telehealth: Payer: Self-pay | Admitting: Diagnostic Neuroimaging

## 2014-09-14 NOTE — Telephone Encounter (Signed)
Per Dr. Marjory Lies cancel MRI--[facility] aware to cancel appt and make patient aware appt is cancelled.

## 2014-09-17 ENCOUNTER — Other Ambulatory Visit: Payer: Self-pay

## 2014-09-30 ENCOUNTER — Inpatient Hospital Stay (HOSPITAL_COMMUNITY)
Admission: AD | Admit: 2014-09-30 | Discharge: 2014-09-30 | Disposition: A | Discharge status: Home / Self Care | Payer: Medicaid Other | Source: Ambulatory Visit | Attending: Obstetrics and Gynecology | Admitting: Obstetrics and Gynecology

## 2014-09-30 ENCOUNTER — Inpatient Hospital Stay (HOSPITAL_COMMUNITY): Payer: Medicaid Other

## 2014-09-30 ENCOUNTER — Encounter (HOSPITAL_COMMUNITY): Payer: Self-pay | Admitting: *Deleted

## 2014-09-30 DIAGNOSIS — F17210 Nicotine dependence, cigarettes, uncomplicated: Secondary | ICD-10-CM | POA: Diagnosis not present

## 2014-09-30 DIAGNOSIS — R1032 Left lower quadrant pain: Secondary | ICD-10-CM | POA: Insufficient documentation

## 2014-09-30 LAB — URINALYSIS, ROUTINE W REFLEX MICROSCOPIC
BILIRUBIN URINE: NEGATIVE
Glucose, UA: NEGATIVE mg/dL
Hgb urine dipstick: NEGATIVE
Ketones, ur: NEGATIVE mg/dL
Leukocytes, UA: NEGATIVE
Nitrite: NEGATIVE
PH: 5.5 (ref 5.0–8.0)
PROTEIN: NEGATIVE mg/dL
Specific Gravity, Urine: >1.03 — ABNORMAL HIGH (ref 1.005–1.030)
UROBILINOGEN UA: 0.2 mg/dL (ref 0.0–1.0)

## 2014-09-30 LAB — COMPREHENSIVE METABOLIC PANEL
ALT: 10 U/L — AB (ref 14–54)
AST: 14 U/L — AB (ref 15–41)
Albumin: 3.6 g/dL (ref 3.5–5.0)
Alkaline Phosphatase: 83 U/L (ref 38–126)
Anion gap: 5 (ref 5–15)
BUN: 6 mg/dL (ref 6–20)
CALCIUM: 9 mg/dL (ref 8.9–10.3)
CO2: 29 mmol/L (ref 22–32)
Chloride: 104 mmol/L (ref 101–111)
Creatinine, Ser: 0.89 mg/dL (ref 0.44–1.00)
GFR calc Af Amer: >60 mL/min (ref >60)
GFR calc non Af Amer: >60 mL/min (ref >60)
GLUCOSE: 89 mg/dL (ref 65–99)
Potassium: 4 mmol/L (ref 3.5–5.1)
Sodium: 138 mmol/L (ref 135–145)
TOTAL PROTEIN: 6.6 g/dL (ref 6.5–8.1)
Total Bilirubin: 0.5 mg/dL (ref 0.3–1.2)

## 2014-09-30 LAB — WET PREP, GENITAL
Trich, Wet Prep: NONE SEEN
YEAST WET PREP: NONE SEEN

## 2014-09-30 LAB — CBC
HEMATOCRIT: 39.4 % (ref 36.0–46.0)
HEMOGLOBIN: 13.8 g/dL (ref 12.0–15.0)
MCH: 28.6 pg (ref 26.0–34.0)
MCHC: 35 g/dL (ref 30.0–36.0)
MCV: 81.7 fL (ref 78.0–100.0)
Platelets: 252 10*3/uL (ref 150–400)
RBC: 4.82 MIL/uL (ref 3.87–5.11)
RDW: 13.3 % (ref 11.5–15.5)
WBC: 6.1 10*3/uL (ref 4.0–10.5)

## 2014-09-30 MED ORDER — HYDROCODONE-IBUPROFEN 7.5-200 MG PO TABS: 1.0000 | ORAL_TABLET | Freq: Four times a day (QID) | ORAL | Status: DC | PRN

## 2014-09-30 MED ORDER — HYDROCODONE-ACETAMINOPHEN 5-325 MG PO TABS
1.0000 | ORAL_TABLET | Freq: Once | ORAL | Status: AC
  Administered 2014-09-30: 1 via ORAL
  Filled 2014-09-30: qty 1

## 2014-09-30 NOTE — MAU Note (Signed)
Pt. Has a history of ovarian cysts.  Pt. Started having lower left abdominal pain yesterday and it has gotten worse.  Pt. Isn't having any bleeding.

## 2014-09-30 NOTE — MAU Provider Note (Signed)
CC: Abdominal Pain    First Provider Initiated Contact with Patient 09/30/14 1040      HPI Janice Pena is a 29 y.o. G2P2 who presents via EMS with onset a few days ago of LLQ constant sharp abdominal pain. The pain is chronic and cyclical every month. She states it is similar to pain she has had with ovarian cysts in the past. Pain does not radiate. It is associated with nausea but not vomiting. It is exacerbated by movement. She states it is usually controlled by Vicoprofen but she took her last tablet yesterday. She was given fentanyl 100 mg IV push by EMS providers and states her pain is still 7-8 on a scale of 10. Hx partial hysterectomy 5 yrs ago. Declines HIV screen (neg 3 mo ago)  Past Medical History  Diagnosis Date  . Asthma   . Anxiety   . HSV infection   . Bilateral ovarian cysts   . Left wrist pain   . Asthma   . Anxiety   . GERD (gastroesophageal reflux disease)   . Vitamin D deficiency     OB History  Gravida Para Term Preterm AB SAB TAB Ectopic Multiple Living  2 2        2     # Outcome Date GA Lbr Len/2nd Weight Sex Delivery Anes PTL Lv  2 Para           1 Para               Past Surgical History  Procedure Laterality Date  . Abdominal hysterectomy      History   Social History  . Marital Status: Married    Spouse Name: N/A  . Number of Children: N/A  . Years of Education: N/A   Occupational History  . Not on file.   Social History Main Topics  . Smoking status: Current Every Day Smoker -- 1.00 packs/day    Types: Cigarettes    Last Attempt to Quit: 06/03/2014  . Smokeless tobacco: Not on file     Comment: 5 or less a day   . Alcohol Use: No  . Drug Use: No  . Sexual Activity: Yes    Birth Control/ Protection: None   Other Topics Concern  . Not on file   Social History Narrative    No current facility-administered medications on file prior to encounter.   Current Outpatient Prescriptions on File Prior to Encounter  Medication  Sig Dispense Refill  . ADVAIR DISKUS 250-50 MCG/DOSE AEPB Inhale into the lungs 2 (two) times daily.   2  . albuterol (PROVENTIL HFA;VENTOLIN HFA) 108 (90 BASE) MCG/ACT inhaler Inhale 1-2 puffs into the lungs every 6 (six) hours as needed for wheezing or shortness of breath (shortness of breath).    . LORazepam (ATIVAN) 1 MG tablet Take 1 mg by mouth every 8 (eight) hours as needed for anxiety or sleep.       Allergies  Allergen Reactions  . Lactose Intolerance (Gi) Diarrhea  . Other     Grass - giver her a rash  . Oxycontin [Oxycodone Hcl] Hives     ROS Review of Systems  Constitutional: Positive for malaise/fatigue. Negative for fever, chills and weight loss.  Respiratory: Negative for cough.   Cardiovascular: Negative for chest pain.  Gastrointestinal: Positive for nausea. Negative for heartburn, vomiting, diarrhea, constipation and blood in stool.  Neurological: Positive for headaches. Negative for dizziness, focal weakness and weakness.  Psychiatric/Behavioral: Negative for depression and substance  abuse.      PHYSICAL EXAM Filed Vitals:   09/30/14 1032  BP: 124/80  Pulse: 54  Temp: 97.9 F (36.6 C)  Resp: 16   General: Well nourished, well developed female in apparent mild discomfort Cardiovascular: Normal rate Respiratory: Normal effort Abdomen: Soft, mildly tender lower LLQ with no guarding or rebound Back: No CVAT Extremities: No edema Neurologic:grossly intact Speculum exam: NEFG; vagina with thick mucusy discharge, no blood; cervix clean Bimanual exam: cervix closed, mild CMT; uterus NSSP; mild left adnexal tenderness no adnexal masses noted    LAB RESULTS Results for orders placed or performed during the hospital encounter of 09/30/14 (from the past 24 hour(s))  Urinalysis, Routine w reflex microscopic     Status: Abnormal   Collection Time: 09/30/14 10:25 AM  Result Value Ref Range   Color, Urine YELLOW YELLOW   APPearance HAZY (A) CLEAR   Specific  Gravity, Urine >1.030 (H) 1.005 - 1.030   pH 5.5 5.0 - 8.0   Glucose, UA NEGATIVE NEGATIVE mg/dL   Hgb urine dipstick NEGATIVE NEGATIVE   Bilirubin Urine NEGATIVE NEGATIVE   Ketones, ur NEGATIVE NEGATIVE mg/dL   Protein, ur NEGATIVE NEGATIVE mg/dL   Urobilinogen, UA 0.2 0.0 - 1.0 mg/dL   Nitrite NEGATIVE NEGATIVE   Leukocytes, UA NEGATIVE NEGATIVE  CBC     Status: None   Collection Time: 09/30/14 11:29 AM  Result Value Ref Range   WBC 6.1 4.0 - 10.5 K/uL   RBC 4.82 3.87 - 5.11 MIL/uL   Hemoglobin 13.8 12.0 - 15.0 g/dL   HCT 17.6 16.0 - 73.7 %   MCV 81.7 78.0 - 100.0 fL   MCH 28.6 26.0 - 34.0 pg   MCHC 35.0 30.0 - 36.0 g/dL   RDW 10.6 26.9 - 48.5 %   Platelets 252 150 - 400 K/uL  Comprehensive metabolic panel     Status: Abnormal   Collection Time: 09/30/14 11:29 AM  Result Value Ref Range   Sodium 138 135 - 145 mmol/L   Potassium 4.0 3.5 - 5.1 mmol/L   Chloride 104 101 - 111 mmol/L   CO2 29 22 - 32 mmol/L   Glucose, Bld 89 65 - 99 mg/dL   BUN 6 6 - 20 mg/dL   Creatinine, Ser 4.62 0.44 - 1.00 mg/dL   Calcium 9.0 8.9 - 70.3 mg/dL   Total Protein 6.6 6.5 - 8.1 g/dL   Albumin 3.6 3.5 - 5.0 g/dL   AST 14 (L) 15 - 41 U/L   ALT 10 (L) 14 - 54 U/L   Alkaline Phosphatase 83 38 - 126 U/L   Total Bilirubin 0.5 0.3 - 1.2 mg/dL   GFR calc non Af Amer >60 >60 mL/min   GFR calc Af Amer >60 >60 mL/min   Anion gap 5 5 - 15  Wet prep, genital     Status: Abnormal   Collection Time: 09/30/14 11:30 AM  Result Value Ref Range   Yeast Wet Prep HPF POC NONE SEEN NONE SEEN   Trich, Wet Prep NONE SEEN NONE SEEN   Clue Cells Wet Prep HPF POC FEW (A) NONE SEEN   WBC, Wet Prep HPF POC FEW (A) NONE SEEN    IMAGING No results found.  CLINICAL DATA: Left lower quadrant abdominal pain.  EXAM: TRANSABDOMINAL AND TRANSVAGINAL ULTRASOUND OF PELVIS  TECHNIQUE: Both transabdominal and transvaginal ultrasound examinations of the pelvis were performed. Transabdominal technique was performed  for global imaging of the pelvis including uterus, ovaries, adnexal  regions, and pelvic cul-de-sac. It was necessary to proceed with endovaginal exam following the transabdominal exam to visualize the ovaries.  COMPARISON: Ultrasound of February 15, 2014.  FINDINGS: Status post hysterectomy.  Right ovary  Measurements: 4.3 x 2.7 x 2.1 cm. Normal appearance/no adnexal mass.  Left ovary  Measurements: 3.9 x 2.3 x 2.2 cm. Normal appearance/no adnexal mass.  Other findings  Trace free fluid is noted which most likely is physiologic.  IMPRESSION: Status post hysterectomy. No abnormality seen involving the ovaries.   Electronically Signed  By: Lupita Raider, M.D.  On: 09/30/2014     MAU COURSE GC/CT sent Norco/Vicodin 5mg  po given  ASSESSMENT  1. Abdominal pain, LLQ     PLAN Discharge home. See AVS for patient education.    Medication List    STOP taking these medications        ALPRAZolam 0.5 MG tablet  Commonly known as:  XANAX      TAKE these medications        ADVAIR DISKUS 250-50 MCG/DOSE Aepb  Generic drug:  Fluticasone-Salmeterol  Inhale into the lungs 2 (two) times daily.     albuterol 108 (90 BASE) MCG/ACT inhaler  Commonly known as:  PROVENTIL HFA;VENTOLIN HFA  Inhale 1-2 puffs into the lungs every 6 (six) hours as needed for wheezing or shortness of breath (shortness of breath).     HYDROcodone-ibuprofen 7.5-200 MG per tablet  Commonly known as:  VICOPROFEN  Take 1 tablet by mouth every 6 (six) hours as needed for moderate pain.     meloxicam 15 MG tablet  Commonly known as:  MOBIC  Take 15 mg by mouth daily.        Follow-up Information    Follow up with OSEI-BONSU,GEORGE, MD. Schedule an appointment as soon as possible for a visit in 1 week.   Specialty:  Internal Medicine   Contact information:   [address] [place] [phone]       [phone], CNM 09/30/2014 10:40  AM

## 2014-09-30 NOTE — Discharge Instructions (Signed)
Abdominal Pain, Women °Abdominal (stomach, pelvic, or belly) pain can be caused by many things. It is important to tell your doctor: °· The location of the pain. °· Does it come and go or is it present all the time? °· Are there things that start the pain (eating certain foods, exercise)? °· Are there other symptoms associated with the pain (fever, nausea, vomiting, diarrhea)? °All of this is helpful to know when trying to find the cause of the pain. °CAUSES  °· Stomach: virus or bacteria infection, or ulcer. °· Intestine: appendicitis (inflamed appendix), regional ileitis (Crohn's disease), ulcerative colitis (inflamed colon), irritable bowel syndrome, diverticulitis (inflamed diverticulum of the colon), or cancer of the stomach or intestine. °· Gallbladder disease or stones in the gallbladder. °· Kidney disease, kidney stones, or infection. °· Pancreas infection or cancer. °· Fibromyalgia (pain disorder). °· Diseases of the female organs: °¨ Uterus: fibroid (non-cancerous) tumors or infection. °¨ Fallopian tubes: infection or tubal pregnancy. °¨ Ovary: cysts or tumors. °¨ Pelvic adhesions (scar tissue). °¨ Endometriosis (uterus lining tissue growing in the pelvis and on the pelvic organs). °¨ Pelvic congestion syndrome (female organs filling up with blood just before the menstrual period). °¨ Pain with the menstrual period. °¨ Pain with ovulation (producing an egg). °¨ Pain with an IUD (intrauterine device, birth control) in the uterus. °¨ Cancer of the female organs. °· Functional pain (pain not caused by a disease, may improve without treatment). °· Psychological pain. °· Depression. °DIAGNOSIS  °Your doctor will decide the seriousness of your pain by doing an examination. °· Blood tests. °· X-rays. °· Ultrasound. °· CT scan (computed tomography, special type of X-ray). °· MRI (magnetic resonance imaging). °· Cultures, for infection. °· Barium enema (dye inserted in the large intestine, to better view it with  X-rays). °· Colonoscopy (looking in intestine with a lighted tube). °· Laparoscopy (minor surgery, looking in abdomen with a lighted tube). °· Major abdominal exploratory surgery (looking in abdomen with a large incision). °TREATMENT  °The treatment will depend on the cause of the pain.  °· Many cases can be observed and treated at home. °· Over-the-counter medicines recommended by your caregiver. °· Prescription medicine. °· Antibiotics, for infection. °· Birth control pills, for painful periods or for ovulation pain. °· Hormone treatment, for endometriosis. °· Nerve blocking injections. °· Physical therapy. °· Antidepressants. °· Counseling with a psychologist or psychiatrist. °· Minor or major surgery. °HOME CARE INSTRUCTIONS  °· Do not take laxatives, unless directed by your caregiver. °· Take over-the-counter pain medicine only if ordered by your caregiver. Do not take aspirin because it can cause an upset stomach or bleeding. °· Try a clear liquid diet (broth or water) as ordered by your caregiver. Slowly move to a bland diet, as tolerated, if the pain is related to the stomach or intestine. °· Have a thermometer and take your temperature several times a day, and record it. °· Bed rest and sleep, if it helps the pain. °· Avoid sexual intercourse, if it causes pain. °· Avoid stressful situations. °· Keep your follow-up appointments and tests, as your caregiver orders. °· If the pain does not go away with medicine or surgery, you may try: °¨ Acupuncture. °¨ Relaxation exercises (yoga, meditation). °¨ Group therapy. °¨ Counseling. °SEEK MEDICAL CARE IF:  °· You notice certain foods cause stomach pain. °· Your home care treatment is not helping your pain. °· You need stronger pain medicine. °· You want your IUD removed. °· You feel faint or   lightheaded. °· You develop nausea and vomiting. °· You develop a rash. °· You are having side effects or an allergy to your medicine. °SEEK IMMEDIATE MEDICAL CARE IF:  °· Your  pain does not go away or gets worse. °· You have a fever. °· Your pain is felt only in portions of the abdomen. The right side could possibly be appendicitis. The left lower portion of the abdomen could be colitis or diverticulitis. °· You are passing blood in your stools (bright red or black tarry stools, with or without vomiting). °· You have blood in your urine. °· You develop chills, with or without a fever. °· You pass out. °MAKE SURE YOU:  °· Understand these instructions. °· Will watch your condition. °· Will get help right away if you are not doing well or get worse. °Document Released: 03/01/2007 Document Revised: 09/18/2013 Document Reviewed: 03/21/2009 °Exit[employer]® Patient Information ©2015 [employer]. This information is not intended to replace advice given to you by your health care provider. Make sure you discuss any questions you have with your health care provider. ° °

## 2014-10-01 LAB — GC/CHLAMYDIA PROBE AMP (~~LOC~~) NOT AT ARMC
CHLAMYDIA, DNA PROBE: NEGATIVE
Neisseria Gonorrhea: NEGATIVE

## 2014-10-20 ENCOUNTER — Encounter (HOSPITAL_COMMUNITY): Payer: Self-pay | Admitting: Emergency Medicine

## 2014-10-20 ENCOUNTER — Emergency Department (HOSPITAL_COMMUNITY)
Admission: EM | Admit: 2014-10-20 | Discharge: 2014-10-20 | Disposition: A | Discharge status: Home / Self Care | Payer: Medicaid Other | Attending: Emergency Medicine | Admitting: Emergency Medicine

## 2014-10-20 DIAGNOSIS — Z8719 Personal history of other diseases of the digestive system: Secondary | ICD-10-CM | POA: Diagnosis not present

## 2014-10-20 DIAGNOSIS — G8929 Other chronic pain: Secondary | ICD-10-CM | POA: Insufficient documentation

## 2014-10-20 DIAGNOSIS — Z8639 Personal history of other endocrine, nutritional and metabolic disease: Secondary | ICD-10-CM | POA: Insufficient documentation

## 2014-10-20 DIAGNOSIS — F419 Anxiety disorder, unspecified: Secondary | ICD-10-CM | POA: Diagnosis not present

## 2014-10-20 DIAGNOSIS — J45909 Unspecified asthma, uncomplicated: Secondary | ICD-10-CM | POA: Insufficient documentation

## 2014-10-20 DIAGNOSIS — Z720 Tobacco use: Secondary | ICD-10-CM | POA: Insufficient documentation

## 2014-10-20 DIAGNOSIS — M5412 Radiculopathy, cervical region: Secondary | ICD-10-CM | POA: Diagnosis not present

## 2014-10-20 DIAGNOSIS — Z8742 Personal history of other diseases of the female genital tract: Secondary | ICD-10-CM | POA: Insufficient documentation

## 2014-10-20 DIAGNOSIS — M542 Cervicalgia: Secondary | ICD-10-CM | POA: Diagnosis not present

## 2014-10-20 DIAGNOSIS — Z8619 Personal history of other infectious and parasitic diseases: Secondary | ICD-10-CM | POA: Insufficient documentation

## 2014-10-20 DIAGNOSIS — M545 Low back pain: Secondary | ICD-10-CM | POA: Insufficient documentation

## 2014-10-20 DIAGNOSIS — Z79899 Other long term (current) drug therapy: Secondary | ICD-10-CM | POA: Insufficient documentation

## 2014-10-20 MED ORDER — TRAMADOL HCL 50 MG PO TABS: 50.0000 mg | ORAL_TABLET | Freq: Four times a day (QID) | ORAL | Status: DC | PRN

## 2014-10-20 MED ORDER — IBUPROFEN 800 MG PO TABS: 800.0000 mg | ORAL_TABLET | Freq: Three times a day (TID) | ORAL | Status: DC

## 2014-10-20 NOTE — ED Provider Notes (Signed)
CSN: [id number]     Arrival date & time 10/20/14  2156 History  This chart was scribed for Ladona Mow, PA-C working with No att. providers found by Elveria Rising, ED Scribe. This patient was seen in room TR08C/TR08C and the patient's care was started at 10:58 PM.   Chief Complaint  Patient presents with  . Neck Pain  . Numbness   HPI HPI Comments: Janice Pena is a 29 y.o. female who presents to the Emergency Department complaining of ongoing neck and back pain. Patient reports neck pain since 07/2014, but reports worsening over the last 2-3 weeks and especially today with increased activity. Patient reports tingling pain in her head, arm and legs which she liken to restless leg syndrome. Patient reports paresthesias in her right arm/hand ongoing since onset of her neck pain. Patient states that she's been informed by a neurologist that her wrist pain is resulting from damage to her cervical spine. Neurologist ordered an MRI which was denied by her insurance. Patient visited her PCP two days ago, who is attempting to refer to another specialist. Patient reports treatment with prescribed Meloxicam and ibuprofen, without relief.    Past Medical History  Diagnosis Date  . Asthma   . Anxiety   . HSV infection   . Bilateral ovarian cysts   . Left wrist pain   . Asthma   . Anxiety   . GERD (gastroesophageal reflux disease)   . Vitamin D deficiency    Past Surgical History  Procedure Laterality Date  . Abdominal hysterectomy     Family History  Problem Relation Age of Onset  . Hypertension Mother   . Hyperlipidemia Mother   . Asthma Mother   . Hypertension Father   . Hyperlipidemia Father   . Asthma Father    History  Substance Use Topics  . Smoking status: Current Every Day Smoker -- 1.00 packs/day    Types: Cigarettes    Last Attempt to Quit: 06/03/2014  . Smokeless tobacco: Not on file     Comment: 5 or less a day   . Alcohol Use: No   OB History    Gravida Para Term  Preterm AB TAB SAB Ectopic Multiple Living   2 2        2      Review of Systems  Constitutional: Negative for fever and chills.  Musculoskeletal: Positive for back pain and neck pain.  Neurological: Negative for weakness and numbness.       Paresthesias     Allergies  Lactose intolerance (gi); Other; and Oxycontin  Home Medications   Prior to Admission medications   Medication Sig Start Date End Date Taking? Authorizing Provider  ADVAIR DISKUS 250-50 MCG/DOSE AEPB Inhale into the lungs 2 (two) times daily.  07/28/14   Historical Provider, MD  albuterol (PROVENTIL HFA;VENTOLIN HFA) 108 (90 BASE) MCG/ACT inhaler Inhale 1-2 puffs into the lungs every 6 (six) hours as needed for wheezing or shortness of breath (shortness of breath).    Historical Provider, MD  HYDROcodone-ibuprofen (VICOPROFEN) 7.5-200 MG per tablet Take 1 tablet by mouth every 6 (six) hours as needed for moderate pain. 09/30/14   Deirdre C Poe, CNM  ibuprofen (ADVIL,MOTRIN) 800 MG tablet Take 1 tablet (800 mg total) by mouth 3 (three) times daily. 10/20/14   12/20/14, PA-C  meloxicam (MOBIC) 15 MG tablet Take 15 mg by mouth daily.    Historical Provider, MD  traMADol (ULTRAM) 50 MG tablet Take 1 tablet (50 mg total)  by mouth every 6 (six) hours as needed. 10/20/14   Ladona Mow, PA-C   Triage Vitals: BP 135/87 mmHg  Pulse 92  Temp(Src) 97.9 F (36.6 C) (Oral)  Resp 16  SpO2 99% Physical Exam  Constitutional: She is oriented to person, place, and time. She appears well-developed and well-nourished. No distress.  HENT:  Head: Normocephalic and atraumatic.  Eyes: EOM are normal.  Neck: Neck supple. No tracheal deviation present.  Cardiovascular: Normal rate.   Pulmonary/Chest: Effort normal. No respiratory distress.  Musculoskeletal: Normal range of motion.  Diffuse C2-T4 tenderness   Neurological: She is alert and oriented to person, place, and time.  Skin: Skin is warm and dry.  Psychiatric: She has a normal mood and  affect. Her behavior is normal.  Nursing note and vitals reviewed.   ED Course  Procedures (including critical care time)  COORDINATION OF CARE: 11:08 PM- Discussed treatment plan with patient at bedside and patient agreed to plan.   Labs Review Labs Reviewed - No data to display  Imaging Review No results found.   EKG Interpretation None      MDM   Final diagnoses:  Chronic neck pain  Cervical radiculopathy    Patient here with flareup of chronic neck pain. Pain is consistent with cervical radiculopathy. Patient has been seen by PCP, referred to orthopedist, patient states she is here tonight for relief of pain, as the meloxicam she has been prescribed to has not been helping. Neurologic exam is benign, patient denying any new signs or symptoms. Do not believe imaging is warranted at this time as patient is being evaluated by multiple providers already for her chronic pain. Patient afebrile, hemodynamically stable and in no acute distress. No concern for meningitis. No concern for new injury or worsening symptoms. Patient stable for discharge, will give short course of pain medicine and strongly encouraged patient follow with her PCP. Return precautions discussed, patient verbalizes understanding and agreement with this plan.  I personally performed the services described in this documentation, which was scribed in my presence. The recorded information has been reviewed and is accurate.  BP 125/90 mmHg  Pulse 92  Temp(Src) 98.1 F (36.7 C) (Oral)  Resp 16  SpO2 100%  Signed,  Ladona Mow, PA-C 1:56 AM    Ladona Mow, PA-C 10/21/14 0157  Glynn Octave, MD 10/21/14 0157

## 2014-10-20 NOTE — Discharge Instructions (Signed)

## 2014-10-20 NOTE — ED Notes (Signed)
Patient here with complaint of neck pain accompanied by paresthesia in left arm and lower extremities at times. States was seen by PCP 2-3 weeks ago and was told she likely has pinched nerves in her neck. Was ordered to have MRI, but couldn't get it because Medicare would not cover the test. Presents tonight because pain is preventing her from sleeping.

## 2014-11-06 ENCOUNTER — Institutional Professional Consult (permissible substitution): Payer: Medicaid Other | Admitting: Neurology

## 2014-11-06 ENCOUNTER — Telehealth: Payer: Self-pay | Admitting: Neurology

## 2014-11-06 NOTE — Telephone Encounter (Signed)
This patient did not show for a revisit appointment today. 

## 2014-11-29 ENCOUNTER — Emergency Department (HOSPITAL_COMMUNITY)
Admission: EM | Admit: 2014-11-29 | Discharge: 2014-11-29 | Disposition: A | Discharge status: Home / Self Care | Payer: Medicaid Other | Attending: Emergency Medicine | Admitting: Emergency Medicine

## 2014-11-29 ENCOUNTER — Emergency Department (HOSPITAL_COMMUNITY): Payer: Medicaid Other

## 2014-11-29 ENCOUNTER — Encounter (HOSPITAL_COMMUNITY): Payer: Self-pay | Admitting: Emergency Medicine

## 2014-11-29 DIAGNOSIS — Z8639 Personal history of other endocrine, nutritional and metabolic disease: Secondary | ICD-10-CM | POA: Insufficient documentation

## 2014-11-29 DIAGNOSIS — R059 Cough, unspecified: Secondary | ICD-10-CM

## 2014-11-29 DIAGNOSIS — Z791 Long term (current) use of non-steroidal anti-inflammatories (NSAID): Secondary | ICD-10-CM | POA: Diagnosis not present

## 2014-11-29 DIAGNOSIS — Z720 Tobacco use: Secondary | ICD-10-CM | POA: Insufficient documentation

## 2014-11-29 DIAGNOSIS — Z8619 Personal history of other infectious and parasitic diseases: Secondary | ICD-10-CM | POA: Insufficient documentation

## 2014-11-29 DIAGNOSIS — F419 Anxiety disorder, unspecified: Secondary | ICD-10-CM | POA: Diagnosis not present

## 2014-11-29 DIAGNOSIS — Z8742 Personal history of other diseases of the female genital tract: Secondary | ICD-10-CM | POA: Insufficient documentation

## 2014-11-29 DIAGNOSIS — J069 Acute upper respiratory infection, unspecified: Secondary | ICD-10-CM | POA: Insufficient documentation

## 2014-11-29 DIAGNOSIS — Z79899 Other long term (current) drug therapy: Secondary | ICD-10-CM | POA: Diagnosis not present

## 2014-11-29 DIAGNOSIS — J45901 Unspecified asthma with (acute) exacerbation: Secondary | ICD-10-CM | POA: Insufficient documentation

## 2014-11-29 DIAGNOSIS — J029 Acute pharyngitis, unspecified: Secondary | ICD-10-CM | POA: Diagnosis present

## 2014-11-29 DIAGNOSIS — K219 Gastro-esophageal reflux disease without esophagitis: Secondary | ICD-10-CM | POA: Insufficient documentation

## 2014-11-29 DIAGNOSIS — R05 Cough: Secondary | ICD-10-CM

## 2014-11-29 LAB — CBC WITH DIFFERENTIAL/PLATELET
BASOS ABS: 0 10*3/uL (ref 0.0–0.1)
Basophils Relative: 0 % (ref 0–1)
EOS PCT: 3 % (ref 0–5)
Eosinophils Absolute: 0.2 10*3/uL (ref 0.0–0.7)
HCT: 39.9 % (ref 36.0–46.0)
Hemoglobin: 13.8 g/dL (ref 12.0–15.0)
Lymphocytes Relative: 45 % (ref 12–46)
Lymphs Abs: 3.1 10*3/uL (ref 0.7–4.0)
MCH: 28.1 pg (ref 26.0–34.0)
MCHC: 34.6 g/dL (ref 30.0–36.0)
MCV: 81.3 fL (ref 78.0–100.0)
Monocytes Absolute: 0.3 10*3/uL (ref 0.1–1.0)
Monocytes Relative: 5 % (ref 3–12)
NEUTROS ABS: 3.2 10*3/uL (ref 1.7–7.7)
Neutrophils Relative %: 47 % (ref 43–77)
PLATELETS: 265 10*3/uL (ref 150–400)
RBC: 4.91 MIL/uL (ref 3.87–5.11)
RDW: 13.4 % (ref 11.5–15.5)
WBC: 6.9 10*3/uL (ref 4.0–10.5)

## 2014-11-29 LAB — BASIC METABOLIC PANEL
ANION GAP: 7 (ref 5–15)
CHLORIDE: 105 mmol/L (ref 101–111)
CO2: 27 mmol/L (ref 22–32)
CREATININE: 0.83 mg/dL (ref 0.44–1.00)
Calcium: 9.3 mg/dL (ref 8.9–10.3)
GFR calc Af Amer: >60 mL/min (ref >60)
GLUCOSE: 89 mg/dL (ref 65–99)
Potassium: 3.5 mmol/L (ref 3.5–5.1)
Sodium: 139 mmol/L (ref 135–145)

## 2014-11-29 LAB — I-STAT TROPONIN, ED: Troponin i, poc: 0.02 ng/mL (ref 0.00–0.08)

## 2014-11-29 MED ORDER — BENZONATATE 100 MG PO CAPS: 100.0000 mg | ORAL_CAPSULE | Freq: Three times a day (TID) | ORAL | Status: DC

## 2014-11-29 MED ORDER — IPRATROPIUM BROMIDE 0.02 % IN SOLN
0.5000 mg | Freq: Once | RESPIRATORY_TRACT | Status: AC
  Administered 2014-11-29: 0.5 mg via RESPIRATORY_TRACT
  Filled 2014-11-29: qty 2.5

## 2014-11-29 MED ORDER — PREDNISONE 20 MG PO TABS
60.0000 mg | ORAL_TABLET | Freq: Once | ORAL | Status: AC
  Administered 2014-11-29: 60 mg via ORAL
  Filled 2014-11-29: qty 3

## 2014-11-29 MED ORDER — ALBUTEROL SULFATE (2.5 MG/3ML) 0.083% IN NEBU
5.0000 mg | INHALATION_SOLUTION | Freq: Once | RESPIRATORY_TRACT | Status: AC
  Administered 2014-11-29: 5 mg via RESPIRATORY_TRACT
  Filled 2014-11-29: qty 6

## 2014-11-29 MED ORDER — PREDNISONE 20 MG PO TABS: 20.0000 mg | ORAL_TABLET | Freq: Every day | ORAL | Status: DC

## 2014-11-29 NOTE — ED Notes (Signed)
Pt to xray

## 2014-11-29 NOTE — ED Provider Notes (Signed)
CSN: [id number]     Arrival date & time 11/29/14  0347 History   First MD Initiated Contact with Patient 11/29/14 [phone]     Chief Complaint  Patient presents with  . Cough  . Nasal Congestion  . Sore Throat     (Consider location/radiation/quality/duration/timing/severity/associated sxs/prior Treatment) HPI Janice Pena is a 29 year old female with past medical history of asthma, anxiety, GERD who presents the ER complaining of productive cough, chest wall pain, back pain 2 weeks. Patient reports over the past 2 days noticed increase in shortness of breath, worsening of her cough. Patient reports using her inhaler more frequently than usual. Patient states her shortness of breath. Consistent with asthma she is in the past. Patient reports her chest discomfort began approximately 2 days ago, is a sharp pain in her central chest, bilateral lower ribs and bilateral lower back. She states his pain is exacerbated with coughing, and deep inspiration. Patient denies any headache, blurred vision, dizziness, weakness, fever, palpitations, nausea, vomiting, abdominal pain, dysuria.  Past Medical History  Diagnosis Date  . Asthma   . Anxiety   . HSV infection   . Bilateral ovarian cysts   . Left wrist pain   . Asthma   . Anxiety   . GERD (gastroesophageal reflux disease)   . Vitamin D deficiency    Past Surgical History  Procedure Laterality Date  . Abdominal hysterectomy     Family History  Problem Relation Age of Onset  . Hypertension Mother   . Hyperlipidemia Mother   . Asthma Mother   . Anxiety disorder Mother   . Depression Mother   . Hypertension Father   . Hyperlipidemia Father   . Asthma Father    History  Substance Use Topics  . Smoking status: Current Every Day Smoker -- 1.00 packs/day    Types: Cigarettes    Last Attempt to Quit: 06/03/2014  . Smokeless tobacco: Not on file     Comment: 5 or less a day   . Alcohol Use: No   OB History    Gravida Para Term  Preterm AB TAB SAB Ectopic Multiple Living   2 2        2      Review of Systems  Constitutional: Negative for fever.  HENT: Negative for trouble swallowing.   Eyes: Negative for visual disturbance.  Respiratory: Positive for chest tightness and shortness of breath.   Cardiovascular: Negative for chest pain.  Gastrointestinal: Negative for nausea, vomiting and abdominal pain.  Genitourinary: Negative for dysuria.  Musculoskeletal: Negative for neck pain.  Skin: Negative for rash.  Neurological: Negative for dizziness, weakness and numbness.  Psychiatric/Behavioral: Negative.       Allergies  Lactose intolerance (gi); Other; and Oxycontin  Home Medications   Prior to Admission medications   Medication Sig Start Date End Date Taking? Authorizing Provider  ADVAIR DISKUS 250-50 MCG/DOSE AEPB Inhale into the lungs 2 (two) times daily.  07/28/14   Historical Provider, MD  albuterol (PROVENTIL HFA;VENTOLIN HFA) 108 (90 BASE) MCG/ACT inhaler Inhale 1-2 puffs into the lungs every 6 (six) hours as needed for wheezing or shortness of breath (shortness of breath).    Historical Provider, MD  ALPRAZolam Prudy Feeler) 0.5 MG tablet Take 0.5 mg by mouth 3 (three) times daily as needed for anxiety.    Historical Provider, MD  benzonatate (TESSALON) 100 MG capsule Take 1 capsule (100 mg total) by mouth every 8 (eight) hours. 11/29/14   Ladona Mow, PA-C  cyanocobalamin 100 MCG  tablet Take 100 mcg by mouth daily.    Historical Provider, MD  folic acid (FOLVITE) 1 MG tablet Take 1 mg by mouth daily.    Historical Provider, MD  gabapentin (NEURONTIN) 300 MG capsule Take 300 mg by mouth at bedtime.    Historical Provider, MD  HYDROcodone-ibuprofen (VICOPROFEN) 7.5-200 MG per tablet Take 1 tablet by mouth every 6 (six) hours as needed for moderate pain. 09/30/14   Deirdre C Poe, CNM  ibuprofen (ADVIL,MOTRIN) 800 MG tablet Take 1 tablet (800 mg total) by mouth 3 (three) times daily. 10/20/14   Ladona Mow, PA-C   meloxicam (MOBIC) 15 MG tablet Take 15 mg by mouth daily.    Historical Provider, MD  omeprazole (PRILOSEC) 20 MG capsule Take 20 mg by mouth daily.    Historical Provider, MD  predniSONE (DELTASONE) 20 MG tablet Take 1 tablet (20 mg total) by mouth daily. 11/29/14   Ladona Mow, PA-C  traMADol (ULTRAM) 50 MG tablet Take 1 tablet (50 mg total) by mouth every 6 (six) hours as needed. 10/20/14   Ladona Mow, PA-C   BP 114/74 mmHg  Pulse 55  Temp(Src) 98.4 F (36.9 C) (Oral)  Resp 20  SpO2 100% Physical Exam  Constitutional: She is oriented to person, place, and time. She appears well-developed and well-nourished. No distress.  HENT:  Head: Normocephalic and atraumatic.  Mouth/Throat: Oropharynx is clear and moist. No oropharyngeal exudate.  Eyes: Right eye exhibits no discharge. Left eye exhibits no discharge. No scleral icterus.  Neck: Normal range of motion.  Cardiovascular: Normal rate, regular rhythm and normal heart sounds.   No murmur heard. Pulmonary/Chest: Effort normal. No accessory muscle usage. No tachypnea. No respiratory distress. She has wheezes in the right upper field and the left upper field.  Abdominal: Soft. There is no tenderness.  Musculoskeletal: Normal range of motion. She exhibits no edema or tenderness.  Neurological: She is alert and oriented to person, place, and time. No cranial nerve deficit. Coordination normal.  Skin: Skin is warm and dry. No rash noted. She is not diaphoretic.  Psychiatric: She has a normal mood and affect.  Nursing note and vitals reviewed.   ED Course  Procedures (including critical care time) Labs Review Labs Reviewed  BASIC METABOLIC PANEL - Abnormal; Notable for the following:    BUN <5 (*)    All other components within normal limits  CBC WITH DIFFERENTIAL/PLATELET  Rosezena Sensor, ED    Imaging Review Dg Chest 2 View  11/29/2014   CLINICAL DATA:  Productive cough.  EXAM: CHEST  2 VIEW  COMPARISON:  12/14/2011 and 09/08/2010   FINDINGS: The heart size and mediastinal contours are within normal limits. Both lungs are clear. The visualized skeletal structures are unremarkable.  IMPRESSION: Normal chest.   Electronically Signed   By: Francene Boyers M.D.   On: 11/29/2014 07:11     EKG Interpretation   Date/Time:  Thursday November 29 2014 07:26:38 EDT Ventricular Rate:  56 PR Interval:  145 QRS Duration: 82 QT Interval:  424 QTC Calculation: 409 R Axis:   73 Text Interpretation:  Sinus rhythm Borderline low voltage, extremity leads  No significant change since last tracing Confirmed by KOHUT  MD, STEPHEN  ([id number]) on 11/29/2014 7:37:58 AM      MDM   Final diagnoses:  Cough  URI (upper respiratory infection)  Asthma exacerbation    Patient here with signs and symptoms consistent with asthma exacerbation versus URI. Patient reporting symptoms have completely resolved  after symptomatic therapy here. Lung exam improved completely after DuoNeb. Chest radiograph without evidence of acute pathology. Lab work unremarkable for abnormalities. Chest pain is atypical for ACS, likely chest wall pain from coughing. Troponin is negative greater than 6 hours after onset of symptoms. Wells criteria 0, no concern for PE. Patient he medically stable and in no acute distress. Patient not hypoxic, non-tachycardic, not tachypneic throughout ER stay.  Patient stable for discharge. Patient placed on course of prednisone, strongly encouraged follow-up with PCP. Return precautions discussed, patient verbalizes understanding and agreement of this plan.  BP 114/74 mmHg  Pulse 55  Temp(Src) 98.4 F (36.9 C) (Oral)  Resp 20  SpO2 100%  Signed,  Ladona Mow, PA-C 7:58 AM   Ladona Mow, PA-C 11/29/14 0758  Ladona Mow, PA-C 11/29/14 1157  Cy Blamer, MD 11/29/14 [phone]

## 2014-11-29 NOTE — ED Notes (Signed)
Patient here with head congestion, throat soreness, cough, back pain, and chest discomfort. States ongoing for 2 weeks and has gotten worse over the past few days.

## 2014-11-29 NOTE — ED Notes (Signed)
Pt back from x-ray.

## 2014-11-29 NOTE — Discharge Instructions (Signed)
Bronchospasm °A bronchospasm is a spasm or tightening of the airways going into the lungs. During a bronchospasm breathing becomes more difficult because the airways get smaller. When this happens there can be coughing, a whistling sound when breathing (wheezing), and difficulty breathing. Bronchospasm is often associated with asthma, but not all patients who experience a bronchospasm have asthma. °CAUSES  °A bronchospasm is caused by inflammation or irritation of the airways. The inflammation or irritation may be triggered by:  °· Allergies (such as to animals, pollen, food, or mold). Allergens that cause bronchospasm may cause wheezing immediately after exposure or many hours later.   °· Infection. Viral infections are believed to be the most common cause of bronchospasm.   °· Exercise.   °· Irritants (such as pollution, cigarette smoke, strong odors, aerosol sprays, and paint fumes).   °· Weather changes. Winds increase molds and pollens in the air. Rain refreshes the air by washing irritants out. Cold air may cause inflammation.   °· Stress and emotional upset.   °SIGNS AND SYMPTOMS  °· Wheezing.   °· Excessive nighttime coughing.   °· Frequent or severe coughing with a simple cold.   °· Chest tightness.   °· Shortness of breath.   °DIAGNOSIS  °Bronchospasm is usually diagnosed through a history and physical exam. Tests, such as chest X-rays, are sometimes done to look for other conditions. °TREATMENT  °· Inhaled medicines can be given to open up your airways and help you breathe. The medicines can be given using either an inhaler or a nebulizer machine. °· Corticosteroid medicines may be given for severe bronchospasm, usually when it is associated with asthma. °HOME CARE INSTRUCTIONS  °· Always have a plan prepared for seeking medical care. Know when to call your health care provider and local emergency services (911 in the [place] Know where you can access local emergency care. °· Only take medicines as  directed by your health care provider. °· If you were prescribed an inhaler or nebulizer machine, ask your health care provider to explain how to use it correctly. Always use a spacer with your inhaler if you were given one. °· It is necessary to remain calm during an attack. Try to relax and breathe more slowly.  °· Control your home environment in the following ways:   °· Change your heating and air conditioning filter at least once a month.   °· Limit your use of fireplaces and wood stoves. °· Do not smoke and do not allow smoking in your home.   °· Avoid exposure to perfumes and fragrances.   °· Get rid of pests (such as roaches and mice) and their droppings.   °· Throw away plants if you see mold on them.   °· Keep your house clean and dust free.   °· Replace carpet with wood, tile, or vinyl flooring. Carpet can trap dander and dust.   °· Use allergy-proof pillows, mattress covers, and box spring covers.   °· Wash bed sheets and blankets every week in hot water and dry them in a dryer.   °· Use blankets that are made of polyester or cotton.   °· Wash hands frequently. °SEEK MEDICAL CARE IF:  °· You have muscle aches.   °· You have chest pain.   °· The sputum changes from clear or white to yellow, green, gray, or bloody.   °· The sputum you cough up gets thicker.   °· There are problems that may be related to the medicine you are given, such as a rash, itching, swelling, or trouble breathing.   °SEEK IMMEDIATE MEDICAL CARE IF:  °· You have worsening wheezing and coughing even   after taking your prescribed medicines.   °· You have increased difficulty breathing.   °· You develop severe chest pain. °MAKE SURE YOU:  °· Understand these instructions. °· Will watch your condition. °· Will get help right away if you are not doing well or get worse. °Document Released: 05/07/2003 Document Revised: 05/09/2013 Document Reviewed: 10/24/2012 °[employer]® Patient Information ©2015 [employer]. This information is not  intended to replace advice given to you by your health care provider. Make sure you discuss any questions you have with your health care provider. ° °Upper Respiratory Infection, Adult °An upper respiratory infection (URI) is also sometimes known as the common cold. The upper respiratory tract includes the nose, sinuses, throat, trachea, and bronchi. Bronchi are the airways leading to the lungs. Most people improve within 1 week, but symptoms can last up to 2 weeks. A residual cough may last even longer.  °CAUSES °Many different viruses can infect the tissues lining the upper respiratory tract. The tissues become irritated and inflamed and often become very moist. Mucus production is also common. A cold is contagious. You can easily spread the virus to others by oral contact. This includes kissing, sharing a glass, coughing, or sneezing. Touching your mouth or nose and then touching a surface, which is then touched by another person, can also spread the virus. °SYMPTOMS  °Symptoms typically develop 1 to 3 days after you come in contact with a cold virus. Symptoms vary from person to person. They may include: °· Runny nose. °· Sneezing. °· Nasal congestion. °· Sinus irritation. °· Sore throat. °· Loss of voice (laryngitis). °· Cough. °· Fatigue. °· Muscle aches. °· Loss of appetite. °· Headache. °· Low-grade fever. °DIAGNOSIS  °You might diagnose your own cold based on familiar symptoms, since most people get a cold 2 to 3 times a year. Your caregiver can confirm this based on your exam. Most importantly, your caregiver can check that your symptoms are not due to another disease such as strep throat, sinusitis, pneumonia, asthma, or epiglottitis. Blood tests, throat tests, and X-rays are not necessary to diagnose a common cold, but they may sometimes be helpful in excluding other more serious diseases. Your caregiver will decide if any further tests are required. °RISKS AND COMPLICATIONS  °You may be at risk for a  more severe case of the common cold if you smoke cigarettes, have chronic heart disease (such as heart failure) or lung disease (such as asthma), or if you have a weakened immune system. The very young and very old are also at risk for more serious infections. Bacterial sinusitis, middle ear infections, and bacterial pneumonia can complicate the common cold. The common cold can worsen asthma and chronic obstructive pulmonary disease (COPD). Sometimes, these complications can require emergency medical care and may be life-threatening. °PREVENTION  °The best way to protect against getting a cold is to practice good hygiene. Avoid oral or hand contact with people with cold symptoms. Wash your hands often if contact occurs. There is no clear evidence that vitamin C, vitamin E, echinacea, or exercise reduces the chance of developing a cold. However, it is always recommended to get plenty of rest and practice good nutrition. °TREATMENT  °Treatment is directed at relieving symptoms. There is no cure. Antibiotics are not effective, because the infection is caused by a virus, not by bacteria. Treatment may include: °· Increased fluid intake. Sports drinks offer valuable electrolytes, sugars, and fluids. °· Breathing heated mist or steam (vaporizer or shower). °· Eating chicken soup   or other clear broths, and maintaining good nutrition. °· Getting plenty of rest. °· Using gargles or lozenges for comfort. °· Controlling fevers with ibuprofen or acetaminophen as directed by your caregiver. °· Increasing usage of your inhaler if you have asthma. °Zinc gel and zinc lozenges, taken in the first 24 hours of the common cold, can shorten the duration and lessen the severity of symptoms. Pain medicines may help with fever, muscle aches, and throat pain. A variety of non-prescription medicines are available to treat congestion and runny nose. Your caregiver can make recommendations and may suggest nasal or lung inhalers for other  symptoms.  °HOME CARE INSTRUCTIONS  °· Only take over-the-counter or prescription medicines for pain, discomfort, or fever as directed by your caregiver. °· Use a warm mist humidifier or inhale steam from a shower to increase air moisture. This may keep secretions moist and make it easier to breathe. °· Drink enough water and fluids to keep your urine clear or pale yellow. °· Rest as needed. °· Return to work when your temperature has returned to normal or as your caregiver advises. You may need to stay home longer to avoid infecting others. You can also use a face mask and careful hand washing to prevent spread of the virus. °SEEK MEDICAL CARE IF:  °· After the first few days, you feel you are getting worse rather than better. °· You need your caregiver's advice about medicines to control symptoms. °· You develop chills, worsening shortness of breath, or brown or red sputum. These may be signs of pneumonia. °· You develop yellow or brown nasal discharge or pain in the face, especially when you bend forward. These may be signs of sinusitis. °· You develop a fever, swollen neck glands, pain with swallowing, or white areas in the back of your throat. These may be signs of strep throat. °SEEK IMMEDIATE MEDICAL CARE IF:  °· You have a fever. °· You develop severe or persistent headache, ear pain, sinus pain, or chest pain. °· You develop wheezing, a prolonged cough, cough up blood, or have a change in your usual mucus (if you have chronic lung disease). °· You develop sore muscles or a stiff neck. °Document Released: 10/28/2000 Document Revised: 07/27/2011 Document Reviewed: 08/09/2013 °ExitCare® Patient Information ©2015 [employer]. This information is not intended to replace advice given to you by your health care provider. Make sure you discuss any questions you have with your health care provider. ° °

## 2014-12-13 ENCOUNTER — Encounter (HOSPITAL_COMMUNITY): Payer: Self-pay | Admitting: Emergency Medicine

## 2014-12-13 ENCOUNTER — Emergency Department (HOSPITAL_COMMUNITY)
Admission: EM | Admit: 2014-12-13 | Discharge: 2014-12-14 | Disposition: A | Discharge status: Home / Self Care | Payer: Medicaid Other | Attending: Emergency Medicine | Admitting: Emergency Medicine

## 2014-12-13 DIAGNOSIS — Z720 Tobacco use: Secondary | ICD-10-CM | POA: Diagnosis not present

## 2014-12-13 DIAGNOSIS — Z79899 Other long term (current) drug therapy: Secondary | ICD-10-CM | POA: Diagnosis not present

## 2014-12-13 DIAGNOSIS — Z8619 Personal history of other infectious and parasitic diseases: Secondary | ICD-10-CM | POA: Diagnosis not present

## 2014-12-13 DIAGNOSIS — K219 Gastro-esophageal reflux disease without esophagitis: Secondary | ICD-10-CM | POA: Insufficient documentation

## 2014-12-13 DIAGNOSIS — M791 Myalgia: Secondary | ICD-10-CM | POA: Insufficient documentation

## 2014-12-13 DIAGNOSIS — R059 Cough, unspecified: Secondary | ICD-10-CM

## 2014-12-13 DIAGNOSIS — F419 Anxiety disorder, unspecified: Secondary | ICD-10-CM | POA: Diagnosis not present

## 2014-12-13 DIAGNOSIS — R509 Fever, unspecified: Secondary | ICD-10-CM | POA: Insufficient documentation

## 2014-12-13 DIAGNOSIS — Z8742 Personal history of other diseases of the female genital tract: Secondary | ICD-10-CM | POA: Diagnosis not present

## 2014-12-13 DIAGNOSIS — J45901 Unspecified asthma with (acute) exacerbation: Secondary | ICD-10-CM | POA: Diagnosis not present

## 2014-12-13 DIAGNOSIS — Z7951 Long term (current) use of inhaled steroids: Secondary | ICD-10-CM | POA: Insufficient documentation

## 2014-12-13 DIAGNOSIS — Z8639 Personal history of other endocrine, nutritional and metabolic disease: Secondary | ICD-10-CM | POA: Insufficient documentation

## 2014-12-13 DIAGNOSIS — R05 Cough: Secondary | ICD-10-CM

## 2014-12-13 DIAGNOSIS — Z791 Long term (current) use of non-steroidal anti-inflammatories (NSAID): Secondary | ICD-10-CM | POA: Insufficient documentation

## 2014-12-13 DIAGNOSIS — R0602 Shortness of breath: Secondary | ICD-10-CM | POA: Diagnosis present

## 2014-12-13 MED ORDER — METHYLPREDNISOLONE SODIUM SUCC 125 MG IJ SOLR
125.0000 mg | Freq: Once | INTRAMUSCULAR | Status: AC
  Administered 2014-12-14: 125 mg via INTRAVENOUS
  Filled 2014-12-13: qty 2

## 2014-12-13 MED ORDER — IPRATROPIUM BROMIDE 0.02 % IN SOLN
1.0000 mg | Freq: Once | RESPIRATORY_TRACT | Status: AC
  Administered 2014-12-14: 1 mg via RESPIRATORY_TRACT
  Filled 2014-12-13: qty 5

## 2014-12-13 MED ORDER — ALBUTEROL (5 MG/ML) CONTINUOUS INHALATION SOLN
10.0000 mg | INHALATION_SOLUTION | RESPIRATORY_TRACT | Status: AC
  Administered 2014-12-14: 10 mg via RESPIRATORY_TRACT
  Filled 2014-12-13: qty 20

## 2014-12-13 MED ORDER — ALBUTEROL SULFATE (2.5 MG/3ML) 0.083% IN NEBU
5.0000 mg | INHALATION_SOLUTION | Freq: Once | RESPIRATORY_TRACT | Status: AC
  Administered 2014-12-13: 5 mg via RESPIRATORY_TRACT
  Filled 2014-12-13: qty 6

## 2014-12-13 NOTE — ED Notes (Signed)
PA at bedside.

## 2014-12-13 NOTE — ED Provider Notes (Signed)
CSN: [id number]     Arrival date & time 12/13/14  2053 History   This chart is scribed for non-physician practitioner, Dierdre Forth, PA-C, working with Tomasita Crumble, MD by Abel Presto, ED Scribe.  This patient was seen in room A05C/A05C and the patient's care was started 11:48 PM.      Chief Complaint  Patient presents with  . Asthma      The history is provided by the patient. No language interpreter was used.   HPI Comments: Janice Pena is a 29 y.o. female who presents to the Emergency Department complaining of constant dry cough, SOB, and wheezing with onset 3 days ago. Pt with h/o asthma and states this feels like an asthma attack.  Pt notes associated chills, subjective fever, and chest wall pain. She reports cough is worse during the day. She states being out in the heat and humidity has caused and increase in her symptoms. Pt was seen in ED  for same on 11/29/14 for evaluation of same. She was given Rx for prednisone and Tessalon which she did not get filled as she did not think it would help.  Pt has not taken any Mucinex for relief. She denies nausea and vomiting.   Past Medical History  Diagnosis Date  . Asthma   . Anxiety   . HSV infection   . Bilateral ovarian cysts   . Left wrist pain   . Asthma   . Anxiety   . GERD (gastroesophageal reflux disease)   . Vitamin D deficiency    Past Surgical History  Procedure Laterality Date  . Abdominal hysterectomy     Family History  Problem Relation Age of Onset  . Hypertension Mother   . Hyperlipidemia Mother   . Asthma Mother   . Anxiety disorder Mother   . Depression Mother   . Hypertension Father   . Hyperlipidemia Father   . Asthma Father    History  Substance Use Topics  . Smoking status: Current Every Day Smoker -- 1.00 packs/day    Types: Cigarettes    Last Attempt to Quit: 06/03/2014  . Smokeless tobacco: Not on file     Comment: 5 or less a day   . Alcohol Use: No   OB History    Gravida  Para Term Preterm AB TAB SAB Ectopic Multiple Living   2 2        2      Review of Systems  Constitutional: Positive for fever (subjective) and chills. Negative for diaphoresis, appetite change, fatigue and unexpected weight change.  HENT: Negative for mouth sores.   Eyes: Negative for visual disturbance.  Respiratory: Positive for cough (dry), shortness of breath and wheezing. Negative for chest tightness.   Cardiovascular: Negative for chest pain.  Gastrointestinal: Negative for nausea, vomiting, abdominal pain, diarrhea and constipation.  Endocrine: Negative for polydipsia, polyphagia and polyuria.  Genitourinary: Negative for dysuria, urgency, frequency and hematuria.  Musculoskeletal: Positive for myalgias. Negative for back pain and neck stiffness.  Skin: Negative for rash.  Allergic/Immunologic: Negative for immunocompromised state.  Neurological: Negative for syncope, light-headedness and headaches.  Hematological: Does not bruise/bleed easily.  Psychiatric/Behavioral: Negative for sleep disturbance. The patient is not nervous/anxious.       Allergies  Lactose intolerance (gi); Other; and Oxycontin  Home Medications   Prior to Admission medications   Medication Sig Start Date End Date Taking? Authorizing Provider  ADVAIR DISKUS 250-50 MCG/DOSE AEPB Inhale 1 puff into the lungs daily.  07/28/14  Yes Historical Provider, MD  albuterol (PROVENTIL HFA;VENTOLIN HFA) 108 (90 BASE) MCG/ACT inhaler Inhale 1-2 puffs into the lungs every 6 (six) hours as needed for wheezing or shortness of breath (shortness of breath).   Yes Historical Provider, MD  ALPRAZolam Prudy Feeler) 0.5 MG tablet Take 0.5 mg by mouth 3 (three) times daily as needed for anxiety.   Yes Historical Provider, MD  HYDROcodone-acetaminophen (NORCO/VICODIN) 5-325 MG per tablet Take 1 tablet by mouth 3 (three) times daily. Patient states she takes medication every day per patient   Yes Historical Provider, MD  azithromycin  (ZITHROMAX) 250 MG tablet Take 1 tablet (250 mg total) by mouth daily. Take first 2 tablets together, then 1 every day until finished. 12/14/14   Bassel Gaskill, PA-C  benzonatate (TESSALON) 100 MG capsule Take 1 capsule (100 mg total) by mouth every 8 (eight) hours. Patient not taking: Reported on 12/13/2014 11/29/14   Ladona Mow, PA-C  gabapentin (NEURONTIN) 300 MG capsule Take 300 mg by mouth at bedtime.    Historical Provider, MD  guaiFENesin (MUCINEX) 600 MG 12 hr tablet Take 2 tablets (1,200 mg total) by mouth 2 (two) times daily. 12/14/14   Roann Merk, PA-C  HYDROcodone-ibuprofen (VICOPROFEN) 7.5-200 MG per tablet Take 1 tablet by mouth every 6 (six) hours as needed for moderate pain. Patient not taking: Reported on 12/13/2014 09/30/14   Deirdre C Poe, CNM  ibuprofen (ADVIL,MOTRIN) 800 MG tablet Take 1 tablet (800 mg total) by mouth 3 (three) times daily. Patient not taking: Reported on 12/13/2014 10/20/14   Ladona Mow, PA-C  meloxicam (MOBIC) 15 MG tablet Take 15 mg by mouth daily.    Historical Provider, MD  omeprazole (PRILOSEC) 20 MG capsule Take 20 mg by mouth daily.    Historical Provider, MD  predniSONE (DELTASONE) 20 MG tablet Take 2 tablets (40 mg total) by mouth daily. 12/14/14   Antwione Picotte, PA-C  traMADol (ULTRAM) 50 MG tablet Take 1 tablet (50 mg total) by mouth every 6 (six) hours as needed. Patient not taking: Reported on 12/13/2014 10/20/14   Ladona Mow, PA-C   BP 135/75 mmHg  Pulse 58  Temp(Src) 98.4 F (36.9 C) (Oral)  Resp 16  Ht 5\' 5"  (1.651 m)  Wt 153 lb 11.2 oz (69.718 kg)  BMI 25.58 kg/m2  SpO2 100% Physical Exam  Constitutional: She is oriented to person, place, and time. She appears well-developed and well-nourished. No distress.  HENT:  Head: Normocephalic and atraumatic.  Right Ear: Tympanic membrane, external ear and ear canal normal.  Left Ear: Tympanic membrane, external ear and ear canal normal.  Nose: No mucosal edema or rhinorrhea. No  epistaxis. Right sinus exhibits no maxillary sinus tenderness and no frontal sinus tenderness. Left sinus exhibits no maxillary sinus tenderness and no frontal sinus tenderness.  Mouth/Throat: Uvula is midline, oropharynx is clear and moist and mucous membranes are normal. Mucous membranes are not pale and not cyanotic. No oropharyngeal exudate, posterior oropharyngeal edema, posterior oropharyngeal erythema or tonsillar abscesses.  Eyes: Conjunctivae are normal. Pupils are equal, round, and reactive to light.  Neck: Normal range of motion and full passive range of motion without pain.  Cardiovascular: Normal rate, normal heart sounds and intact distal pulses.   No murmur heard. Pulmonary/Chest: Effort normal. No stridor. She has decreased breath sounds. She exhibits tenderness.  Diminished breath sounds throughout without focal wheezes, rhonchi, rales Mild TTP of the anterior chest  Abdominal: Soft. Bowel sounds are normal. There is no tenderness.  Musculoskeletal: Normal  range of motion.  Lymphadenopathy:    She has no cervical adenopathy.  Neurological: She is alert and oriented to person, place, and time.  Skin: Skin is warm and dry. No rash noted. She is not diaphoretic.  Psychiatric: She has a normal mood and affect.  Nursing note and vitals reviewed.   ED Course  Procedures (including critical care time) DIAGNOSTIC STUDIES: Oxygen Saturation is 100% on room air, normal by my interpretation.    COORDINATION OF CARE: 11:54 PM Discussed treatment plan with patient at beside, the patient agrees with the plan and has no further questions at this time.   Labs Review Labs Reviewed - No data to display  Imaging Review No results found.   EKG Interpretation None      MDM   Final diagnoses:  Cough  Asthma exacerbation   Janice Pena presents with c/o persistent tightness in her chest.  She did not fill the medications prescribed to her at her prior visit including her  prednisone.  She has no hx of admission or intubation for her asthma.  Clinically with diminished breath sounds, but no focal wheezes, rhonchi or rales.  Will give additional nebulizer, steroids and obtain CXR.    1:34 AM Pt reports she is feeling much better.  Significantly increased tidal volume with clear breath sounds.    2:07 AM Pt continues to feel better.  Her continuous nebulizer is complete.  She is declining the CXR stating that she feels well and does not believe she has PNA.  She does request an antibiotic.  Patient ambulated in ED with O2 saturations maintained >90, no current signs of respiratory distress. Lung exam improved after nebulizer treatment. Prednisone given in the ED and pt will be dc with 5 day burst. Pt states they are breathing at baseline. Pt has been instructed to continue using prescribed medications and to speak with PCP about today's exacerbation.   BP 135/75 mmHg  Pulse 58  Temp(Src) 98.4 F (36.9 C) (Oral)  Resp 16  Ht 5\' 5"  (1.651 m)  Wt 153 lb 11.2 oz (69.718 kg)  BMI 25.58 kg/m2  SpO2 100%  I personally performed the services described in this documentation, which was scribed in my presence. The recorded information has been reviewed and is accurate.    Mannat Benedetti, PA-C 12/14/14 12/16/14  [id number], MD 12/14/14 [phone]

## 2014-12-13 NOTE — ED Notes (Signed)
Pt. reports asthma attack with dry cough and mild wheezing onset 3 days ago unrelieved by rescue MDI , pt. added neck pain , denies injury.

## 2014-12-14 MED ORDER — PREDNISONE 20 MG PO TABS: 40.0000 mg | ORAL_TABLET | Freq: Every day | ORAL | Status: DC

## 2014-12-14 MED ORDER — AZITHROMYCIN 250 MG PO TABS: 250.0000 mg | ORAL_TABLET | Freq: Every day | ORAL | Status: DC

## 2014-12-14 MED ORDER — GUAIFENESIN ER 600 MG PO TB12: 1200.0000 mg | ORAL_TABLET | Freq: Two times a day (BID) | ORAL | Status: DC

## 2014-12-14 NOTE — Discharge Instructions (Signed)
1. Medications: albuterol, prednisone, mucinex, azithromycin, usual home medications 2. Treatment: rest, drink plenty of fluids, begin OTC antihistamine (Zyrtec or Claritin)  3. Follow Up: Please followup with your primary doctor in 2-3 days for discussion of your diagnoses and further evaluation after today's visit; if you do not have a primary care doctor use the resource guide provided to find one; Please return to the ER for difficulty breathing, high fevers or worsening symptoms.    Asthma, Acute Bronchospasm Acute bronchospasm caused by asthma is also referred to as an asthma attack. Bronchospasm means your air passages become narrowed. The narrowing is caused by inflammation and tightening of the muscles in the air tubes (bronchi) in your lungs. This can make it hard to breathe or cause you to wheeze and cough. CAUSES Possible triggers are:  Animal dander from the skin, hair, or feathers of animals.  Dust mites contained in house dust.  Cockroaches.  Pollen from trees or grass.  Mold.  Cigarette or tobacco smoke.  Air pollutants such as dust, household cleaners, hair sprays, aerosol sprays, paint fumes, strong chemicals, or strong odors.  Cold air or weather changes. Cold air may trigger inflammation. Winds increase molds and pollens in the air.  Strong emotions such as crying or laughing hard.  Stress.  Certain medicines such as aspirin or beta-blockers.  Sulfites in foods and drinks, such as dried fruits and wine.  Infections or inflammatory conditions, such as a flu, cold, or inflammation of the nasal membranes (rhinitis).  Gastroesophageal reflux disease (GERD). GERD is a condition where stomach acid backs up into your esophagus.  Exercise or strenuous activity. SIGNS AND SYMPTOMS   Wheezing.  Excessive coughing, particularly at night.  Chest tightness.  Shortness of breath. DIAGNOSIS  Your health care provider will ask you about your medical history and  perform a physical exam. A chest X-ray or blood testing may be performed to look for other causes of your symptoms or other conditions that may have triggered your asthma attack. TREATMENT  Treatment is aimed at reducing inflammation and opening up the airways in your lungs. Most asthma attacks are treated with inhaled medicines. These include quick relief or rescue medicines (such as bronchodilators) and controller medicines (such as inhaled corticosteroids). These medicines are sometimes given through an inhaler or a nebulizer. Systemic steroid medicine taken by mouth or given through an IV tube also can be used to reduce the inflammation when an attack is moderate or severe. Antibiotic medicines are only used if a bacterial infection is present.  HOME CARE INSTRUCTIONS   Rest.  Drink plenty of liquids. This helps the mucus to remain thin and be easily coughed up. Only use caffeine in moderation and do not use alcohol until you have recovered from your illness.  Do not smoke. Avoid being exposed to secondhand smoke.  You play a critical role in keeping yourself in good health. Avoid exposure to things that cause you to wheeze or to have breathing problems.  Keep your medicines up-to-date and available. Carefully follow your health care provider's treatment plan.  Take your medicine exactly as prescribed.  When pollen or pollution is bad, keep windows closed and use an air conditioner or go to places with air conditioning.  Asthma requires careful medical care. See your health care provider for a follow-up as advised. If you are more than [redacted] weeks pregnant and you were prescribed any new medicines, let your obstetrician know about the visit and how you are doing. Follow  up with your health care provider as directed.  After you have recovered from your asthma attack, make an appointment with your outpatient doctor to talk about ways to reduce the likelihood of future attacks. If you do not  have a doctor who manages your asthma, make an appointment with a primary care doctor to discuss your asthma. SEEK IMMEDIATE MEDICAL CARE IF:   You are getting worse.  You have trouble breathing. If severe, call your local emergency services (911 in the [place]  You develop chest pain or discomfort.  You are vomiting.  You are not able to keep fluids down.  You are coughing up yellow, green, brown, or bloody sputum.  You have a fever and your symptoms suddenly get worse.  You have trouble swallowing. MAKE SURE YOU:   Understand these instructions.  Will watch your condition.  Will get help right away if you are not doing well or get worse. Document Released: 08/19/2006 Document Revised: 05/09/2013 Document Reviewed: 11/09/2012 [facility] Patient Information 2015 [place], [place]. This information is not intended to replace advice given to you by your health care provider. Make sure you discuss any questions you have with your health care provider.

## 2015-01-12 ENCOUNTER — Encounter (HOSPITAL_COMMUNITY): Payer: Self-pay | Admitting: *Deleted

## 2015-01-12 ENCOUNTER — Inpatient Hospital Stay (HOSPITAL_COMMUNITY): Payer: Medicaid Other

## 2015-01-12 ENCOUNTER — Inpatient Hospital Stay (HOSPITAL_COMMUNITY)
Admission: AD | Admit: 2015-01-12 | Discharge: 2015-01-12 | Disposition: A | Discharge status: Home / Self Care | Payer: Medicaid Other | Source: Ambulatory Visit | Attending: Obstetrics and Gynecology | Admitting: Obstetrics and Gynecology

## 2015-01-12 DIAGNOSIS — Z90710 Acquired absence of both cervix and uterus: Secondary | ICD-10-CM | POA: Insufficient documentation

## 2015-01-12 DIAGNOSIS — E559 Vitamin D deficiency, unspecified: Secondary | ICD-10-CM | POA: Diagnosis not present

## 2015-01-12 DIAGNOSIS — Z818 Family history of other mental and behavioral disorders: Secondary | ICD-10-CM | POA: Diagnosis not present

## 2015-01-12 DIAGNOSIS — Z8249 Family history of ischemic heart disease and other diseases of the circulatory system: Secondary | ICD-10-CM | POA: Insufficient documentation

## 2015-01-12 DIAGNOSIS — J45909 Unspecified asthma, uncomplicated: Secondary | ICD-10-CM | POA: Insufficient documentation

## 2015-01-12 DIAGNOSIS — R1032 Left lower quadrant pain: Secondary | ICD-10-CM | POA: Insufficient documentation

## 2015-01-12 DIAGNOSIS — F419 Anxiety disorder, unspecified: Secondary | ICD-10-CM | POA: Diagnosis not present

## 2015-01-12 DIAGNOSIS — Z8489 Family history of other specified conditions: Secondary | ICD-10-CM | POA: Diagnosis not present

## 2015-01-12 DIAGNOSIS — F17200 Nicotine dependence, unspecified, uncomplicated: Secondary | ICD-10-CM | POA: Diagnosis not present

## 2015-01-12 DIAGNOSIS — K219 Gastro-esophageal reflux disease without esophagitis: Secondary | ICD-10-CM | POA: Diagnosis not present

## 2015-01-12 HISTORY — DX: Rheumatoid vasculitis with rheumatoid arthritis of unspecified site: M05.20

## 2015-01-12 LAB — URINALYSIS, ROUTINE W REFLEX MICROSCOPIC
Glucose, UA: NEGATIVE mg/dL
HGB URINE DIPSTICK: NEGATIVE
Ketones, ur: NEGATIVE mg/dL
Leukocytes, UA: NEGATIVE
Nitrite: NEGATIVE
PROTEIN: NEGATIVE mg/dL
Urobilinogen, UA: 0.2 mg/dL (ref 0.0–1.0)
pH: 5.5 (ref 5.0–8.0)

## 2015-01-12 MED ORDER — ONDANSETRON 8 MG PO TBDP
8.0000 mg | ORAL_TABLET | Freq: Once | ORAL | Status: AC
  Administered 2015-01-12: 8 mg via ORAL
  Filled 2015-01-12: qty 1

## 2015-01-12 MED ORDER — KETOROLAC TROMETHAMINE 60 MG/2ML IM SOLN
60.0000 mg | Freq: Once | INTRAMUSCULAR | Status: AC
  Administered 2015-01-12: 60 mg via INTRAMUSCULAR
  Filled 2015-01-12: qty 2

## 2015-01-12 NOTE — MAU Note (Signed)
Pt states she takes oxycodone 5 mg up to 3 times a day, states she took all three at once this a.m.

## 2015-01-12 NOTE — MAU Provider Note (Signed)
History     CSN: [id number]  Arrival date and time: 01/12/15 1319   First Provider Initiated Contact with Patient 01/12/15 1407      Chief Complaint  Patient presents with  . Ovarian Cyst   HPI Janice Pena 29 y.o.  Comes to MAU by EMS from work with LLQ pain.  Has a history of a 5 cm ovarian cyst in October 2015 but an ultrasound in May 2016 did not show any ovarian cyst.  Client says she has had pain for 2 days.  Had sex yesterday.  Did not sleep at all last night.  Took hydrocodone 5/325 - 3 tablets together at 9 am.  Has made her feel like she was floating but did not stop the pain.  Client goes to a pain clinic and says she does take 3 tablets a day of this pain medication but she took them all at the same time today to see if she could stop her pain.  Client has had a hysterectomy and wants to have her left ovary removed.   OB History    Gravida Para Term Preterm AB TAB SAB Ectopic Multiple Living   2 2        2       Past Medical History  Diagnosis Date  . Asthma   . Anxiety   . HSV infection   . Bilateral ovarian cysts   . Left wrist pain   . Asthma   . Anxiety   . GERD (gastroesophageal reflux disease)   . Vitamin D deficiency   . Rheumatoid arteritis     Past Surgical History  Procedure Laterality Date  . Abdominal hysterectomy      Family History  Problem Relation Age of Onset  . Hypertension Mother   . Hyperlipidemia Mother   . Asthma Mother   . Anxiety disorder Mother   . Depression Mother   . Hypertension Father   . Hyperlipidemia Father   . Asthma Father     Social History  Substance Use Topics  . Smoking status: Current Every Day Smoker -- 1.00 packs/day    Types: Cigarettes    Last Attempt to Quit: 06/03/2014  . Smokeless tobacco: None     Comment: 5 or less a day   . Alcohol Use: No    Allergies:  Allergies  Allergen Reactions  . Lactose Intolerance (Gi) Diarrhea  . Other     Grass - giver her a rash  . Oxycontin [Oxycodone  Hcl] Hives    Prescriptions prior to admission  Medication Sig Dispense Refill Last Dose  . budesonide-formoterol (SYMBICORT) 80-4.5 MCG/ACT inhaler Inhale 2 puffs into the lungs 2 (two) times daily.   Past Week at Unknown time  . Cholecalciferol (VITAMIN D3) 5000 UNITS TABS Take 1 tablet by mouth daily.   Past Week at Unknown time  . HYDROcodone-acetaminophen (NORCO/VICODIN) 5-325 MG per tablet Take 1 tablet by mouth 3 (three) times daily. Patient states she takes medication every day per patient   01/12/2015 at Unknown time  . ADVAIR DISKUS 250-50 MCG/DOSE AEPB Inhale 1 puff into the lungs daily.   2 prn  . albuterol (PROVENTIL HFA;VENTOLIN HFA) 108 (90 BASE) MCG/ACT inhaler Inhale 1-2 puffs into the lungs every 6 (six) hours as needed for wheezing or shortness of breath (shortness of breath).   rescue  . azithromycin (ZITHROMAX) 250 MG tablet Take 1 tablet (250 mg total) by mouth daily. Take first 2 tablets together, then 1 every day until  finished. (Patient not taking: Reported on 01/12/2015) 6 tablet 0   . benzonatate (TESSALON) 100 MG capsule Take 1 capsule (100 mg total) by mouth every 8 (eight) hours. (Patient not taking: Reported on 12/13/2014) 21 capsule 0 Not Taking at Unknown time  . guaiFENesin (MUCINEX) 600 MG 12 hr tablet Take 2 tablets (1,200 mg total) by mouth 2 (two) times daily. (Patient not taking: Reported on 01/12/2015) 20 tablet 0   . HYDROcodone-ibuprofen (VICOPROFEN) 7.5-200 MG per tablet Take 1 tablet by mouth every 6 (six) hours as needed for moderate pain. (Patient not taking: Reported on 12/13/2014) 15 tablet 0 Not Taking at Unknown time  . ibuprofen (ADVIL,MOTRIN) 800 MG tablet Take 1 tablet (800 mg total) by mouth 3 (three) times daily. (Patient not taking: Reported on 12/13/2014) 21 tablet 0 Not Taking at Unknown time  . predniSONE (DELTASONE) 20 MG tablet Take 2 tablets (40 mg total) by mouth daily. (Patient not taking: Reported on 01/12/2015) 10 tablet 0   . traMADol  (ULTRAM) 50 MG tablet Take 1 tablet (50 mg total) by mouth every 6 (six) hours as needed. (Patient not taking: Reported on 12/13/2014) 15 tablet 0 Not Taking at Unknown time    Review of Systems  Constitutional: Negative for fever.  Gastrointestinal: Positive for abdominal pain. Negative for nausea and vomiting.  Genitourinary:       No vaginal discharge. No vaginal bleeding. No dysuria.   Physical Exam   Blood pressure 133/94, pulse 77, temperature 97.6 F (36.4 C), temperature source Oral, resp. rate 18, height 5\' 2"  (1.575 m), weight 151 lb 2 oz (68.55 kg).  Physical Exam  Nursing note and vitals reviewed. Constitutional: She is oriented to person, place, and time. She appears well-developed and well-nourished.  Lying on her stomach in the bed.  Aroused and turned over easily in bed to talk with provider.  Seems frustrated and easily agitated.  HENT:  Head: Normocephalic.  Eyes: EOM are normal.  Neck: Neck supple.  GI: Soft. There is tenderness. There is no rebound and no guarding.  Has LLQ tenderness in the low LLQ near the groin.  Musculoskeletal: Normal range of motion.  Neurological: She is alert and oriented to person, place, and time.  Skin: Skin is warm and dry.  Psychiatric: She has a normal mood and affect.    MAU Course  Procedures  CLINICAL DATA: Left lower quadrant pain since partial hysterectomy.  EXAM: TRANSABDOMINAL AND TRANSVAGINAL ULTRASOUND OF PELVIS  TECHNIQUE: Both transabdominal and transvaginal ultrasound examinations of the pelvis were performed. Transabdominal technique was performed for global imaging of the pelvis including uterus, ovaries, adnexal regions, and pelvic cul-de-sac. It was necessary to proceed with endovaginal exam following the transabdominal exam to visualize the ovaries.  COMPARISON: None  FINDINGS: Uterus  Measurements: Prior hysterectomy.  Endometrium  Thickness: N/A.  Right ovary  Measurements: 3.4  x 2.2 x 2.3 cm. Normal appearance/no adnexal mass.  Left ovary  Measurements: 3.1 x 1.9 x 2.3 cm. There is 1.3 x 1.0 x 2.6 cm soft tissue density in the left adnexa adjacent to the left ovary, separate from the left ovary. This could reflect collapsed bowel.  Other findings  No free fluid.  IMPRESSION: Prior hysterectomy.  Ovaries unremarkable.  Soft tissue in the left adnexa adjacent to the left ovary felt to reflect collapsed bowel. No acute findings.     MDM Will give Toradol to see if pain improves and get ultrasound to confirm presence of ovarian cyst as  the reason for her pain. Got Toradol and then rested in bed and was fine  - no pain.  Assessment and Plan  Abdominal pain LLQ - unknown cause - No ovarian cyst Possible GI symptoms and possible constipation as client takes narcotic pain medication daily through the pain clinic  Plan There are no ovarian cysts today.  Your pain is coming from another source. If you take narcotic pain medication regularly, you may have constipation and that may be causing your left sided pain. Keep your appointments as scheduled Try to decrease pain medication if you can or switch to ibuprofen to see if it can manage your pain. Eat a healthy diet with high fiber and 8 glasses of water daily. If your pain worsens, be seen at [facility] for more follow up.   BURLESON,TERRI 01/12/2015, 2:16 PM

## 2015-01-12 NOTE — MAU Note (Signed)
Pt states here for left ovarian cyst that feels like it is getting bigger. No bleeding or abnormal discharge. Goes to pain clinic and thinks she has built a tolerance to the meds.

## 2015-01-12 NOTE — Discharge Instructions (Signed)
There are no ovarian cysts today.  Your pain is coming from another source. If you take narcotic pain medication regularly, you may have constipation and that may be causing your left sided pain. Keep your appointments as scheduled Try to decrease pain medication if you can or switch to ibuprofen to see if it can manage your pain. Eat a healthy diet with high fiber and 8 glasses of water daily. If your pain worsens, be seen at [facility] for more follow up.

## 2015-03-06 ENCOUNTER — Encounter (HOSPITAL_COMMUNITY): Payer: Self-pay | Admitting: Nurse Practitioner

## 2015-03-06 ENCOUNTER — Emergency Department (HOSPITAL_COMMUNITY)
Admission: EM | Admit: 2015-03-06 | Discharge: 2015-03-07 | Discharge status: Against Medical Advice | Payer: Medicaid Other | Attending: Emergency Medicine | Admitting: Emergency Medicine

## 2015-03-06 DIAGNOSIS — Z7951 Long term (current) use of inhaled steroids: Secondary | ICD-10-CM | POA: Diagnosis not present

## 2015-03-06 DIAGNOSIS — Z3202 Encounter for pregnancy test, result negative: Secondary | ICD-10-CM | POA: Insufficient documentation

## 2015-03-06 DIAGNOSIS — Z8659 Personal history of other mental and behavioral disorders: Secondary | ICD-10-CM | POA: Insufficient documentation

## 2015-03-06 DIAGNOSIS — R112 Nausea with vomiting, unspecified: Secondary | ICD-10-CM | POA: Diagnosis not present

## 2015-03-06 DIAGNOSIS — R42 Dizziness and giddiness: Secondary | ICD-10-CM | POA: Diagnosis not present

## 2015-03-06 DIAGNOSIS — J45909 Unspecified asthma, uncomplicated: Secondary | ICD-10-CM | POA: Insufficient documentation

## 2015-03-06 DIAGNOSIS — R531 Weakness: Secondary | ICD-10-CM | POA: Insufficient documentation

## 2015-03-06 DIAGNOSIS — M069 Rheumatoid arthritis, unspecified: Secondary | ICD-10-CM | POA: Insufficient documentation

## 2015-03-06 DIAGNOSIS — Z8719 Personal history of other diseases of the digestive system: Secondary | ICD-10-CM | POA: Diagnosis not present

## 2015-03-06 DIAGNOSIS — R1032 Left lower quadrant pain: Secondary | ICD-10-CM | POA: Diagnosis not present

## 2015-03-06 DIAGNOSIS — Z8619 Personal history of other infectious and parasitic diseases: Secondary | ICD-10-CM | POA: Insufficient documentation

## 2015-03-06 DIAGNOSIS — N939 Abnormal uterine and vaginal bleeding, unspecified: Secondary | ICD-10-CM | POA: Diagnosis not present

## 2015-03-06 DIAGNOSIS — Z720 Tobacco use: Secondary | ICD-10-CM | POA: Diagnosis not present

## 2015-03-06 DIAGNOSIS — Z8639 Personal history of other endocrine, nutritional and metabolic disease: Secondary | ICD-10-CM | POA: Insufficient documentation

## 2015-03-06 LAB — CBC
HCT: 41.3 % (ref 36.0–46.0)
Hemoglobin: 14.7 g/dL (ref 12.0–15.0)
MCH: 29.3 pg (ref 26.0–34.0)
MCHC: 35.6 g/dL (ref 30.0–36.0)
MCV: 82.4 fL (ref 78.0–100.0)
PLATELETS: 297 10*3/uL (ref 150–400)
RBC: 5.01 MIL/uL (ref 3.87–5.11)
RDW: 13.1 % (ref 11.5–15.5)
WBC: 10.3 10*3/uL (ref 4.0–10.5)

## 2015-03-06 LAB — POC URINE PREG, ED: PREG TEST UR: NEGATIVE

## 2015-03-06 MED ORDER — FENTANYL CITRATE (PF) 100 MCG/2ML IJ SOLN
50.0000 ug | Freq: Once | INTRAMUSCULAR | Status: AC
  Administered 2015-03-06: 50 ug via INTRAVENOUS
  Filled 2015-03-06: qty 2

## 2015-03-06 MED ORDER — ONDANSETRON HCL 4 MG/2ML IJ SOLN
4.0000 mg | Freq: Once | INTRAMUSCULAR | Status: AC | PRN
Start: 2015-03-06 — End: 2015-03-06
  Administered 2015-03-06: 4 mg via INTRAVENOUS
  Filled 2015-03-06: qty 2

## 2015-03-06 NOTE — ED Notes (Signed)
RN drawing blood work 

## 2015-03-06 NOTE — ED Notes (Signed)
Bed: NO03 Expected date:  Expected time:  Means of arrival:  Comments: EMS/[id number]/abdpain

## 2015-03-06 NOTE — ED Notes (Signed)
Pt is c/o LLQ abdominal pain 10/10, N/V onset few days ago. Remarks on hx of ovarian cyst stating feels as if it similar.

## 2015-03-07 ENCOUNTER — Emergency Department (HOSPITAL_COMMUNITY): Payer: Medicaid Other

## 2015-03-07 LAB — URINE MICROSCOPIC-ADD ON

## 2015-03-07 LAB — LIPASE, BLOOD: Lipase: 31 U/L (ref 22–51)

## 2015-03-07 LAB — COMPREHENSIVE METABOLIC PANEL
ALBUMIN: 4.3 g/dL (ref 3.5–5.0)
ALT: 14 U/L (ref 14–54)
ANION GAP: 6 (ref 5–15)
AST: 20 U/L (ref 15–41)
Alkaline Phosphatase: 88 U/L (ref 38–126)
BUN: 11 mg/dL (ref 6–20)
CO2: 27 mmol/L (ref 22–32)
Calcium: 9.9 mg/dL (ref 8.9–10.3)
Chloride: 105 mmol/L (ref 101–111)
Creatinine, Ser: 0.87 mg/dL (ref 0.44–1.00)
GFR calc Af Amer: >60 mL/min (ref >60)
GFR calc non Af Amer: >60 mL/min (ref >60)
GLUCOSE: 110 mg/dL — AB (ref 65–99)
POTASSIUM: 3.9 mmol/L (ref 3.5–5.1)
SODIUM: 138 mmol/L (ref 135–145)
TOTAL PROTEIN: 7.9 g/dL (ref 6.5–8.1)
Total Bilirubin: 0.5 mg/dL (ref 0.3–1.2)

## 2015-03-07 LAB — URINALYSIS, ROUTINE W REFLEX MICROSCOPIC
GLUCOSE, UA: NEGATIVE mg/dL
Hgb urine dipstick: NEGATIVE
KETONES UR: NEGATIVE mg/dL
NITRITE: NEGATIVE
Protein, ur: NEGATIVE mg/dL
SPECIFIC GRAVITY, URINE: 1.034 — AB (ref 1.005–1.030)
Urobilinogen, UA: 2 mg/dL — ABNORMAL HIGH (ref 0.0–1.0)
pH: 6 (ref 5.0–8.0)

## 2015-03-07 MED ORDER — SODIUM CHLORIDE 0.9 % IV BOLUS (SEPSIS)
1000.0000 mL | Freq: Once | INTRAVENOUS | Status: AC
  Administered 2015-03-07: 1000 mL via INTRAVENOUS

## 2015-03-07 MED ORDER — KETOROLAC TROMETHAMINE 30 MG/ML IJ SOLN
30.0000 mg | Freq: Once | INTRAMUSCULAR | Status: AC
  Administered 2015-03-07: 30 mg via INTRAVENOUS
  Filled 2015-03-07: qty 1

## 2015-03-07 MED ORDER — DICYCLOMINE HCL 10 MG/ML IM SOLN
20.0000 mg | Freq: Once | INTRAMUSCULAR | Status: DC
  Filled 2015-03-07: qty 2

## 2015-03-07 NOTE — ED Provider Notes (Signed)
CSN: [id number]     Arrival date & time 03/06/15  2301 History  By signing my name below, I, Budd Palmer, attest that this documentation has been prepared under the direction and in the presence of Devoria Albe, MD at 0130. Electronically Signed: Budd Palmer, ED Scribe. 03/07/2015. 1:45 AM.    Chief Complaint  Patient presents with  . Abdominal Pain  . N/V    The history is provided by the patient. No language interpreter was used.   HPI Comments: Janice Pena is a 29 y.o. female smoker at 8-10 cigarettes per day who presents to the Emergency Department complaining of constant, cramping, "like contractions" LLQ abdominal pain on October 18, with nausea and  vomiting, onset earlier today.  She reports associated inability to sleep due to pain, vaginal bleeding (resolved), lightheadedness, and weakness. She states she had her last normal period 6 years ago before she had her hysterectomy. She states she saw a "smidge" of blood on her panties today. When she wipes with toilet paper there was a small amount of blood. She surmises she scratched herself with her long artificial nails on her labia. She states she took a Xanax for her anxiety with no relief. She states she has had similar pains before with her PMHx of ovarian cysts. She states she was seen 2 months ago by her PCP, at which time no cysts were found on [place]. She reports a PSHx of hysterectomy. 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 She reports a FHx of severe menstrual cycles and states that several of her female relatives have also had partial or complete hysterectomies. Pt denies diarrhea, rectal bleeding, and hematuria.  PCP Dr Gerome Apley  Past Medical History  Diagnosis Date  . Asthma   . Anxiety   . HSV infection   . Bilateral ovarian cysts   . Left wrist pain   . Asthma   . Anxiety   . GERD  (gastroesophageal reflux disease)   . Vitamin D deficiency   . Rheumatoid arteritis    Past Surgical History  Procedure Laterality Date  . Abdominal hysterectomy     Family History  Problem Relation Age of Onset  . Hypertension Mother   . Hyperlipidemia Mother   . Asthma Mother   . Anxiety disorder Mother   . Depression Mother   . Hypertension Father   . Hyperlipidemia Father   . Asthma Father    Social History  Substance Use Topics  . Smoking status: Current Every Day Smoker -- 1.00 packs/day    Types: Cigarettes    Last Attempt to Quit: 06/03/2014  . Smokeless tobacco: None     Comment: 5 or less a day   . Alcohol Use: No   Smokes 8-10 cigs a day Works in home care  OB History    Gravida Para Term Preterm AB TAB SAB Ectopic Multiple Living   Review of Systems  Gastrointestinal: Positive for nausea, vomiting and abdominal pain. Negative for diarrhea and anal bleeding.  Genitourinary: Positive for vaginal bleeding. Negative for hematuria.  Neurological: Positive for weakness and light-headedness.  All other systems reviewed and are negative.   Allergies  Lactose intolerance (gi); Other; and Oxycontin  Home Medications   Prior to Admission medications   Medication Sig Start  Date End Date Taking? Authorizing Provider  albuterol (PROVENTIL HFA;VENTOLIN HFA) 108 (90 BASE) MCG/ACT inhaler Inhale 1-2 puffs into the lungs every 6 (six) hours as needed for wheezing or shortness of breath (shortness of breath).   Yes Historical Provider, MD  budesonide-formoterol (SYMBICORT) 80-4.5 MCG/ACT inhaler Inhale 2 puffs into the lungs 2 (two) times daily.   Yes Historical Provider, MD  guaiFENesin (MUCINEX) 600 MG 12 hr tablet Take 2 tablets (1,200 mg total) by mouth 2 (two) times daily. Patient not taking: Reported on 01/12/2015 12/14/14   Dahlia Client Muthersbaugh, PA-C  ibuprofen (ADVIL,MOTRIN) 800 MG tablet Take 1 tablet (800 mg total) by mouth 3 (three) times  daily. Patient not taking: Reported on 12/13/2014 10/20/14   Ladona Mow, PA-C   BP 145/89 mmHg  Pulse 63  Temp(Src) 97.6 F (36.4 C) (Oral)  Resp 16  SpO2 100%  Vital signs normal   Physical Exam  Constitutional: She is oriented to person, place, and time. She appears well-developed and well-nourished.  Non-toxic appearance. She does not appear ill. No distress.  HENT:  Head: Normocephalic and atraumatic.  Right Ear: External ear normal.  Left Ear: External ear normal.  Nose: Nose normal. No mucosal edema or rhinorrhea.  Mouth/Throat: Oropharynx is clear and moist and mucous membranes are normal. No dental abscesses or uvula swelling.  Eyes: Conjunctivae and EOM are normal. Pupils are equal, round, and reactive to light.  Neck: Normal range of motion and full passive range of motion without pain. Neck supple.  Cardiovascular: Normal rate, regular rhythm and normal heart sounds.  Exam reveals no gallop and no friction rub.   No murmur heard. Pulmonary/Chest: Effort normal and breath sounds normal. No respiratory distress. She has no wheezes. She has no rhonchi. She has no rales. She exhibits no tenderness and no crepitus.  Abdominal: Soft. Normal appearance and bowel sounds are normal. She exhibits no distension. There is tenderness. There is no rebound and no guarding.  TTP to the LLQ  Musculoskeletal: Normal range of motion. She exhibits no edema or tenderness.  Moves all extremities well.   Neurological: She is alert and oriented to person, place, and time. She has normal strength. No cranial nerve deficit.  Skin: Skin is warm, dry and intact. No rash noted. No erythema. No pallor.  Psychiatric: She has a normal mood and affect. Her speech is normal and behavior is normal. Her mood appears not anxious.  Nursing note and vitals reviewed.   ED Course  Procedures  Medications  dicyclomine (BENTYL) injection 20 mg (20 mg Intramuscular Not Given 03/07/15 0408)  ondansetron (ZOFRAN)  injection 4 mg (4 mg Intravenous Given 03/06/15 2349)  fentaNYL (SUBLIMAZE) injection 50 mcg (50 mcg Intravenous Given 03/06/15 2349)  sodium chloride 0.9 % bolus 1,000 mL (0 mLs Intravenous Stopped 03/07/15 0312)  ketorolac (TORADOL) 30 MG/ML injection 30 mg (30 mg Intravenous Given 03/07/15 0154)    DIAGNOSTIC STUDIES: Oxygen Saturation is 100% on RA, normal by my interpretation.    COORDINATION OF CARE: 1:44 AM - Discussed plans to order pain medication and an [place]. Pt advised of plan for treatment and pt agrees.  Recheck at 4 AM patient states the Toradol injection did not help her pain. I was going give her Bentyl to see if that would help however she said that she has been on hydrocodone 7.5 mg tablets and she ran out. She states she has an appointment later this morning with the pain clinic and she prefers just to follow  up with them. I then went to my office to do her discharge papers, however nurses report she ambulated out of the emergency department.   Review of the [place] shows patient gets #30 alprazolam 0.5 mg tablets monthly from her PCP. She also gets #90 hydrocodone 7.5/325 monthly, last time filled was September 23 by Dr.Gyarteng-Dakwa and his PA [facility] Review Results for orders placed or performed during the hospital encounter of 03/06/15  Lipase, blood  Result Value Ref Range   Lipase 31 22 - 51 U/L  Comprehensive metabolic panel  Result Value Ref Range   Sodium 138 135 - 145 mmol/L   Potassium 3.9 3.5 - 5.1 mmol/L   Chloride 105 101 - 111 mmol/L   CO2 27 22 - 32 mmol/L   Glucose, Bld 110 (H) 65 - 99 mg/dL   BUN 11 6 - 20 mg/dL   Creatinine, Ser 8.26 0.44 - 1.00 mg/dL   Calcium 9.9 8.9 - 41.5 mg/dL   Total Protein 7.9 6.5 - 8.1 g/dL   Albumin 4.3 3.5 - 5.0 g/dL   AST 20 15 - 41 U/L   ALT 14 14 - 54 U/L   Alkaline Phosphatase 88 38 - 126 U/L   Total Bilirubin 0.5 0.3 - 1.2 mg/dL   GFR calc non Af Amer >60 >60 mL/min   GFR  calc Af Amer >60 >60 mL/min   Anion gap 6 5 - 15  CBC  Result Value Ref Range   WBC 10.3 4.0 - 10.5 K/uL   RBC 5.01 3.87 - 5.11 MIL/uL   Hemoglobin 14.7 12.0 - 15.0 g/dL   HCT 83.0 94.0 - 76.8 %   MCV 82.4 78.0 - 100.0 fL   MCH 29.3 26.0 - 34.0 pg   MCHC 35.6 30.0 - 36.0 g/dL   RDW 08.8 11.0 - 31.5 %   Platelets 297 150 - 400 K/uL  Urinalysis, Routine w reflex microscopic (not at [facility])  Result Value Ref Range   Color, Urine AMBER (A) YELLOW   APPearance CLOUDY (A) CLEAR   Specific Gravity, Urine 1.034 (H) 1.005 - 1.030   pH 6.0 5.0 - 8.0   Glucose, UA NEGATIVE NEGATIVE mg/dL   Hgb urine dipstick NEGATIVE NEGATIVE   Bilirubin Urine SMALL (A) NEGATIVE   Ketones, ur NEGATIVE NEGATIVE mg/dL   Protein, ur NEGATIVE NEGATIVE mg/dL   Urobilinogen, UA 2.0 (H) 0.0 - 1.0 mg/dL   Nitrite NEGATIVE NEGATIVE   Leukocytes, UA SMALL (A) NEGATIVE  Urine microscopic-add on  Result Value Ref Range   Squamous Epithelial / LPF MANY (A) RARE   WBC, UA 11-20 <3 WBC/hpf   RBC / HPF 0-2 <3 RBC/hpf   Bacteria, UA MANY (A) RARE   Urine-Other MUCOUS PRESENT   POC urine preg, ED (not at [facility])  Result Value Ref Range   Preg Test, Ur NEGATIVE NEGATIVE    Laboratory interpretation all normal except contaminated concentrated urine Imaging Review US Transvaginal Non-ob  US Pelvis Complete  03/07/2015  CLINICAL DATA:  Acute onset of left lower quadrant abdominal pain. Initial encounter. EXAM: TRANSABDOMINAL AND TRANSVAGINAL ULTRASOUND OF PELVIS TECHNIQUE: Both transabdominal and transvaginal ultrasound examinations of the pelvis were performed. Transabdominal technique was performed for global imaging of the pelvis including uterus, ovaries, adnexal regions, and pelvic cul-de-sac. It was necessary to proceed with endovaginal exam following the transabdominal exam to visualize the uterus and ovaries in greater detail. COMPARISON:  Pelvic ultrasound performed 01/12/2015 FINDINGS: Uterus Status  post  hysterectomy. Right ovary Measurements: 2.8 x 2.1 x 2.1 cm. Normal appearance/no adnexal mass. Left ovary Measurements: 3.3 x 2.4 x 2.4 cm. Normal appearance/no adnexal mass. Other findings No free fluid is seen within the pelvic cul-de-sac. Vague 2.3 cm soft tissue density at the left adnexa is relatively stable and may simply reflect collapsed bowel. Would consider follow-up pelvic ultrasound in 2-3 months to ensure stability. IMPRESSION: 1. Status post hysterectomy. Ovaries unremarkable in appearance. No evidence for ovarian torsion. 2. Vague 2.3 cm soft tissue density at the left adnexa is relatively stable and may simply reflect collapsed bowel. Would consider follow-up pelvic ultrasound in 2-3 months to ensure stability. Electronically Signed   By: Roanna Raider M.D.   On: 03/07/2015 03:12   I have personally reviewed and evaluated these images and lab results as part of my medical decision-making.   EKG Interpretation None      MDM   Final diagnoses:  LLQ pain   Pt left before her discharge papers could be done.   I personally performed the services described in this documentation, which was scribed in my presence. The recorded information has been reviewed and considered.  Devoria Albe, MD, Concha Pyo, MD 03/07/15 [phone]

## 2015-03-07 NOTE — ED Notes (Addendum)
Patient called to nursing station wanting her IV taken out so that she could leave. Updated vital signs, reassessed pain, and discontinued IV at patients request. Dr. Lars Mage did go in the room and order medication for patient. When going back into room to administer medication, pt was gone and gown left on the stretcher. Informed Dr. Lars Mage.

## 2015-04-04 ENCOUNTER — Encounter (HOSPITAL_COMMUNITY): Payer: Self-pay | Admitting: Cardiology

## 2015-04-04 ENCOUNTER — Emergency Department (HOSPITAL_COMMUNITY)
Admission: EM | Admit: 2015-04-04 | Discharge: 2015-04-04 | Disposition: A | Discharge status: Home / Self Care | Payer: Medicaid Other | Attending: Emergency Medicine | Admitting: Emergency Medicine

## 2015-04-04 DIAGNOSIS — Z8619 Personal history of other infectious and parasitic diseases: Secondary | ICD-10-CM | POA: Insufficient documentation

## 2015-04-04 DIAGNOSIS — Z8719 Personal history of other diseases of the digestive system: Secondary | ICD-10-CM | POA: Insufficient documentation

## 2015-04-04 DIAGNOSIS — Z8659 Personal history of other mental and behavioral disorders: Secondary | ICD-10-CM | POA: Diagnosis not present

## 2015-04-04 DIAGNOSIS — Z8639 Personal history of other endocrine, nutritional and metabolic disease: Secondary | ICD-10-CM | POA: Insufficient documentation

## 2015-04-04 DIAGNOSIS — M542 Cervicalgia: Secondary | ICD-10-CM | POA: Diagnosis not present

## 2015-04-04 DIAGNOSIS — M1990 Unspecified osteoarthritis, unspecified site: Secondary | ICD-10-CM | POA: Insufficient documentation

## 2015-04-04 DIAGNOSIS — Z8543 Personal history of malignant neoplasm of ovary: Secondary | ICD-10-CM | POA: Insufficient documentation

## 2015-04-04 DIAGNOSIS — M62830 Muscle spasm of back: Secondary | ICD-10-CM | POA: Diagnosis not present

## 2015-04-04 DIAGNOSIS — J45909 Unspecified asthma, uncomplicated: Secondary | ICD-10-CM | POA: Insufficient documentation

## 2015-04-04 DIAGNOSIS — M545 Low back pain, unspecified: Secondary | ICD-10-CM

## 2015-04-04 DIAGNOSIS — Z8739 Personal history of other diseases of the musculoskeletal system and connective tissue: Secondary | ICD-10-CM

## 2015-04-04 DIAGNOSIS — F17210 Nicotine dependence, cigarettes, uncomplicated: Secondary | ICD-10-CM | POA: Diagnosis not present

## 2015-04-04 DIAGNOSIS — Z79899 Other long term (current) drug therapy: Secondary | ICD-10-CM | POA: Diagnosis not present

## 2015-04-04 MED ORDER — KETOROLAC TROMETHAMINE 30 MG/ML IJ SOLN
30.0000 mg | Freq: Once | INTRAMUSCULAR | Status: AC
  Administered 2015-04-04: 30 mg via INTRAMUSCULAR
  Filled 2015-04-04: qty 1

## 2015-04-04 MED ORDER — CYCLOBENZAPRINE HCL 10 MG PO TABS
5.0000 mg | ORAL_TABLET | Freq: Once | ORAL | Status: AC
  Administered 2015-04-04: 5 mg via ORAL
  Filled 2015-04-04: qty 1

## 2015-04-04 MED ORDER — CYCLOBENZAPRINE HCL 10 MG PO TABS: 10.0000 mg | ORAL_TABLET | Freq: Three times a day (TID) | ORAL | Status: DC | PRN

## 2015-04-04 MED ORDER — PREDNISONE 20 MG PO TABS: ORAL_TABLET | ORAL | Status: DC

## 2015-04-04 MED ORDER — IBUPROFEN 800 MG PO TABS: 800.0000 mg | ORAL_TABLET | Freq: Three times a day (TID) | ORAL | Status: DC | PRN

## 2015-04-04 MED ORDER — PREDNISONE 20 MG PO TABS
60.0000 mg | ORAL_TABLET | Freq: Once | ORAL | Status: AC
  Administered 2015-04-04: 60 mg via ORAL
  Filled 2015-04-04: qty 3

## 2015-04-04 NOTE — Discharge Instructions (Signed)
Back Pain: Your back pain should be treated with medicines such as ibuprofen or aleve and this back pain should get better over the next 2 weeks.  However if you develop severe or worsening pain, low back pain with fever, numbness, weakness or inability to walk or urinate, you should return to the ER immediately.  Please follow up with your doctor this week for a recheck if still having symptoms.  Avoid heavy lifting over 10 pounds over the next two weeks.  Low back pain is discomfort in the lower back that may be due to injuries to muscles and ligaments around the spine.  Occasionally, it may be caused by a a problem to a part of the spine called a disc.  The pain may last several days or a week;  However, most patients get completely well in 4 weeks.  Self - care:  The application of heat can help soothe the pain.  Maintaining your daily activities, including walking, is encourged, as it will help you get better faster than just staying in bed. Perform gentle stretching as discussed. Drink plenty of fluids.  Medications are also useful to help with pain control.  A commonly prescribed medication includes tylenol.  Non steroidal anti inflammatory medications including Ibuprofen and naproxen;  These medications help both pain and swelling and are very useful in treating back pain.  They should be taken with food, as they can cause stomach upset, and more seriously, stomach bleeding.    Muscle relaxants:  These medications can help with muscle tightness that is a cause of lower back pain.  Most of these medications can cause drowsiness, and it is not safe to drive or use dangerous machinery while taking them.  SEEK IMMEDIATE MEDICAL ATTENTION IF: New numbness, tingling, weakness, or problem with the use of your arms or legs.  Severe back pain not relieved with medications.  Difficulty with or loss of control of your bowel or bladder control.  Increasing pain in any areas of the body (such as chest or  abdominal pain).  Shortness of breath, dizziness or fainting.  Nausea (feeling sick to your stomach), vomiting, fever, or sweats.  You will need to follow up with  Your primary healthcare provider in 1-2 weeks for reassessment.   Back Pain, Adult Back pain is very common in adults.The cause of back pain is rarely dangerous and the pain often gets better over time.The cause of your back pain may not be known. Some common causes of back pain include:  Strain of the muscles or ligaments supporting the spine.  Wear and tear (degeneration) of the spinal disks.  Arthritis.  Direct injury to the back. For many people, back pain may return. Since back pain is rarely dangerous, most people can learn to manage this condition on their own. HOME CARE INSTRUCTIONS Watch your back pain for any changes. The following actions may help to lessen any discomfort you are feeling:  Remain active. It is stressful on your back to sit or stand in one place for long periods of time. Do not sit, drive, or stand in one place for more than 30 minutes at a time. Take short walks on even surfaces as soon as you are able.Try to increase the length of time you walk each day.  Exercise regularly as directed by your health care provider. Exercise helps your back heal faster. It also helps avoid future injury by keeping your muscles strong and flexible.  Do not stay in bed.Resting more  than 1-2 days can delay your recovery.  Pay attention to your body when you bend and lift. The most comfortable positions are those that put less stress on your recovering back. Always use proper lifting techniques, including:  Bending your knees.  Keeping the load close to your body.  Avoiding twisting.  Find a comfortable position to sleep. Use a firm mattress and lie on your side with your knees slightly bent. If you lie on your back, put a pillow under your knees.  Avoid feeling anxious or stressed.Stress increases muscle  tension and can worsen back pain.It is important to recognize when you are anxious or stressed and learn ways to manage it, such as with exercise.  Take medicines only as directed by your health care provider. Over-the-counter medicines to reduce pain and inflammation are often the most helpful.Your health care provider may prescribe muscle relaxant drugs.These medicines help dull your pain so you can more quickly return to your normal activities and healthy exercise.  Apply ice to the injured area:  Put ice in a plastic bag.  Place a towel between your skin and the bag.  Leave the ice on for 20 minutes, 2-3 times a day for the first 2-3 days. After that, ice and heat may be alternated to reduce pain and spasms.  Maintain a healthy weight. Excess weight puts extra stress on your back and makes it difficult to maintain good posture. SEEK MEDICAL CARE IF:  You have pain that is not relieved with rest or medicine.  You have increasing pain going down into the legs or buttocks.  You have pain that does not improve in one week.  You have night pain.  You lose weight.  You have a fever or chills. SEEK IMMEDIATE MEDICAL CARE IF:   You develop new bowel or bladder control problems.  You have unusual weakness or numbness in your arms or legs.  You develop nausea or vomiting.  You develop abdominal pain.  You feel faint.   This information is not intended to replace advice given to you by your health care provider. Make sure you discuss any questions you have with your health care provider.   Document Released: 05/04/2005 Document Revised: 05/25/2014 Document Reviewed: 09/05/2013 Elsevier Interactive Patient Education 2016 [place].  Back Exercises If you have pain in your back, do these exercises 2-3 times each day or as told by your doctor. When the pain goes away, do the exercises once each day, but repeat the steps more times for each exercise (do more repetitions). If  you do not have pain in your back, do these exercises once each day or as told by your doctor. EXERCISES Single Knee to Chest Do these steps 3-5 times in a row for each leg:  Lie on your back on a firm bed or the floor with your legs stretched out.  Bring one knee to your chest.  Hold your knee to your chest by grabbing your knee or thigh.  Pull on your knee until you feel a gentle stretch in your lower back.  Keep doing the stretch for 10-30 seconds.  Slowly let go of your leg and straighten it. Pelvic Tilt Do these steps 5-10 times in a row:  Lie on your back on a firm bed or the floor with your legs stretched out.  Bend your knees so they point up to the ceiling. Your feet should be flat on the floor.  Tighten your lower belly (abdomen) muscles to press  your lower back against the floor. This will make your tailbone point up to the ceiling instead of pointing down to your feet or the floor.  Stay in this position for 5-10 seconds while you gently tighten your muscles and breathe evenly. Cat-Cow Do these steps until your lower back bends more easily:  Get on your hands and knees on a firm surface. Keep your hands under your shoulders, and keep your knees under your hips. You may put padding under your knees.  Let your head hang down, and make your tailbone point down to the floor so your lower back is round like the back of a cat.  Stay in this position for 5 seconds.  Slowly lift your head and make your tailbone point up to the ceiling so your back hangs low (sags) like the back of a cow.  Stay in this position for 5 seconds. Press-Ups Do these steps 5-10 times in a row:  Lie on your belly (face-down) on the floor.  Place your hands near your head, about shoulder-width apart.  While you keep your back relaxed and keep your hips on the floor, slowly straighten your arms to raise the top half of your body and lift your shoulders. Do not use your back muscles. To make  yourself more comfortable, you may change where you place your hands.  Stay in this position for 5 seconds.  Slowly return to lying flat on the floor. Bridges Do these steps 10 times in a row:  Lie on your back on a firm surface.  Bend your knees so they point up to the ceiling. Your feet should be flat on the floor.  Tighten your butt muscles and lift your butt off of the floor until your waist is almost as high as your knees. If you do not feel the muscles working in your butt and the back of your thighs, slide your feet 1-2 inches farther away from your butt.  Stay in this position for 3-5 seconds.  Slowly lower your butt to the floor, and let your butt muscles relax. If this exercise is too easy, try doing it with your arms crossed over your chest. Belly Crunches Do these steps 5-10 times in a row:  Lie on your back on a firm bed or the floor with your legs stretched out.  Bend your knees so they point up to the ceiling. Your feet should be flat on the floor.  Cross your arms over your chest.  Tip your chin a little bit toward your chest but do not bend your neck.  Tighten your belly muscles and slowly raise your chest just enough to lift your shoulder blades a tiny bit off of the floor.  Slowly lower your chest and your head to the floor. Back Lifts Do these steps 5-10 times in a row: 1. Lie on your belly (face-down) with your arms at your sides, and rest your forehead on the floor. 2. Tighten the muscles in your legs and your butt. 3. Slowly lift your chest off of the floor while you keep your hips on the floor. Keep the back of your head in line with the curve in your back. Look at the floor while you do this. 4. Stay in this position for 3-5 seconds. 5. Slowly lower your chest and your face to the floor. GET HELP IF:  Your back pain gets a lot worse when you do an exercise.  Your back pain does not lessen 2 hours  after you exercise. If you have any of these  problems, stop doing the exercises. Do not do them again unless your doctor says it is okay. GET HELP RIGHT AWAY IF:  You have sudden, very bad back pain. If this happens, stop doing the exercises. Do not do them again unless your doctor says it is okay.   This information is not intended to replace advice given to you by your health care provider. Make sure you discuss any questions you have with your health care provider.   Document Released: 06/06/2010 Document Revised: 01/23/2015 Document Reviewed: 06/28/2014 Elsevier Interactive Patient Education 2016 [place] Injury Prevention Back injuries can be very painful. They can also be difficult to heal. After having one back injury, you are more likely to injure your back again. It is important to learn how to avoid injuring or re-injuring your back. The following tips can help you to prevent a back injury. WHAT SHOULD I KNOW ABOUT PHYSICAL FITNESS?  Exercise for 30 minutes per day on most days of the week or as told by your doctor. Make sure to:  Do aerobic exercises, such as walking, jogging, biking, or swimming.  Do exercises that increase balance and strength, such as tai chi and yoga.  Do stretching exercises. This helps with flexibility.  Try to develop strong belly (abdominal) muscles. Your belly muscles help to support your back.  Stay at a healthy weight. This helps to decrease your risk of a back injury. WHAT SHOULD I KNOW ABOUT MY DIET?  Talk with your doctor about your overall diet. Take supplements and vitamins only as told by your doctor.  Talk with your doctor about how much calcium and vitamin D you need each day. These nutrients help to prevent weakening of the bones (osteoporosis).  Include good sources of calcium in your diet, such as:  Dairy products.  Green leafy vegetables.  Products that have had calcium added to them (fortified).  Include good sources of vitamin D in your diet, such  as:  Milk.  Foods that have had vitamin D added to them. WHAT SHOULD I KNOW ABOUT MY POSTURE?  Sit up straight and stand up straight. Avoid leaning forward when you sit or hunching over when you stand.  Choose chairs that have good low-back (lumbar) support.  If you work at a desk, sit close to it so you do not need to lean over. Keep your chin tucked in. Keep your neck drawn back. Keep your elbows bent so your arms look like the letter "L" (right angle).  Sit high and close to the steering wheel when you drive. Add a low-back support to your car seat, if needed.  Avoid sitting or standing in one position for very long. Take breaks to get up, stretch, and walk around at least one time every hour. Take breaks every hour if you are driving for long periods of time.  Sleep on your side with your knees slightly bent, or sleep on your back with a pillow under your knees. Do not lie on the front of your body to sleep. WHAT SHOULD I KNOW ABOUT LIFTING, TWISTING, AND REACHING Lifting and Heavy Lifting  Avoid heavy lifting, especially lifting over and over again. If you must do heavy lifting:  Stretch before lifting.  Work slowly.  Rest between lifts.  Use a tool such as a cart or a dolly to move objects if one is available.  Make several small trips instead of carrying  one heavy load.  Ask for help when you need it, especially when moving big objects.  Follow these steps when lifting:  Stand with your feet shoulder-width apart.  Get as close to the object as you can. Do not pick up a heavy object that is far from your body.  Use handles or lifting straps if they are available.  Bend at your knees. Squat down, but keep your heels off the floor.  Keep your shoulders back. Keep your chin tucked in. Keep your back straight.  Lift the object slowly while you tighten the muscles in your legs, belly, and butt. Keep the object as close to the center of your body as possible.  Follow  these steps when putting down a heavy load:  Stand with your feet shoulder-width apart.  Lower the object slowly while you tighten the muscles in your legs, belly, and butt. Keep the object as close to the center of your body as possible.  Keep your shoulders back. Keep your chin tucked in. Keep your back straight.  Bend at your knees. Squat down, but keep your heels off the floor.  Use handles or lifting straps if they are available. Twisting and Reaching  Avoid lifting heavy objects above your waist.  Do not twist at your waist while you are lifting or carrying a load. If you need to turn, move your feet.  Do not bend over without bending at your knees.  Avoid reaching over your head, across a table, or for an object on a high surface.  WHAT ARE SOME OTHER TIPS?  Avoid wet floors and icy ground. Keep sidewalks clear of ice to prevent falls.   Do not sleep on a mattress that is too soft or too hard.   Keep items that you use often within easy reach.   Put heavier objects on shelves at waist level, and put lighter objects on lower or higher shelves.  Find ways to lower your stress, such as:  Exercise.  Massage.  Relaxation techniques.  Talk with your doctor if you feel anxious or depressed. These conditions can make back pain worse.  Wear flat heel shoes with cushioned soles.  Avoid making quick (sudden) movements.  Use both shoulder straps when carrying a backpack.  Do not use any tobacco products, including cigarettes, chewing tobacco, or electronic cigarettes. If you need help quitting, ask your doctor.   This information is not intended to replace advice given to you by your health care provider. Make sure you discuss any questions you have with your health care provider.   Document Released: 10/21/2007 Document Revised: 09/18/2014 Document Reviewed: 05/08/2014 Elsevier Interactive Patient Education 2016 [employer]  Muscle Cramps and Spasms Muscle  cramps and spasms are when muscles tighten by themselves. They usually get better within minutes. Muscle cramps are painful. They are usually stronger and last longer than muscle spasms. Muscle spasms may or may not be painful. They can last a few seconds or much longer. HOME CARE  Drink enough fluid to keep your pee (urine) clear or pale yellow.  Massage, stretch, and relax the muscle.  Use a warm towel, heating pad, or warm shower water on tight muscles.  Place ice on the muscle if it is tender or in pain.  Put ice in a plastic bag.  Place a towel between your skin and the bag.  Leave the ice on for 15-20 minutes, 03-04 times a day.  Only take medicine as told by your doctor. GET  HELP RIGHT AWAY IF:  Your cramps or spasms get worse, happen more often, or do not get better with time. MAKE SURE YOU:  Understand these instructions.  Will watch your condition.  Will get help right away if you are not doing well or get worse.   This information is not intended to replace advice given to you by your health care provider. Make sure you discuss any questions you have with your health care provider.   Document Released: 04/16/2008 Document Revised: 08/29/2012 Document Reviewed: 04/20/2012 Elsevier Interactive Patient Education 2016 [place] therapy can help ease sore, stiff, injured, and tight muscles and joints. Heat relaxes your muscles, which may help ease your pain.  RISKS AND COMPLICATIONS If you have any of the following conditions, do not use heat therapy unless your health care provider has approved:  Poor circulation.  Healing wounds or scarred skin in the area being treated.  Diabetes, heart disease, or high blood pressure.  Not being able to feel (numbness) the area being treated.  Unusual swelling of the area being treated.  Active infections.  Blood clots.  Cancer.  Inability to communicate pain. This may include young children and  people who have problems with their brain function (dementia).  Pregnancy. Heat therapy should only be used on old, pre-existing, or long-lasting (chronic) injuries. Do not use heat therapy on new injuries unless directed by your health care provider. HOW TO USE HEAT THERAPY There are several different kinds of heat therapy, including:  Moist heat pack.  Warm water bath.  Hot water bottle.  Electric heating pad.  Heated gel pack.  Heated wrap.  Electric heating pad. Use the heat therapy method suggested by your health care provider. Follow your health care provider's instructions on when and how to use heat therapy. GENERAL HEAT THERAPY RECOMMENDATIONS  Do not sleep while using heat therapy. Only use heat therapy while you are awake.  Your skin may turn pink while using heat therapy. Do not use heat therapy if your skin turns red.  Do not use heat therapy if you have new pain.  High heat or long exposure to heat can cause burns. Be careful when using heat therapy to avoid burning your skin.  Do not use heat therapy on areas of your skin that are already irritated, such as with a rash or sunburn. SEEK MEDICAL CARE IF:  You have blisters, redness, swelling, or numbness.  You have new pain.  Your pain is worse. MAKE SURE YOU:  Understand these instructions.  Will watch your condition.  Will get help right away if you are not doing well or get worse.   This information is not intended to replace advice given to you by your health care provider. Make sure you discuss any questions you have with your health care provider.   Document Released: 07/27/2011 Document Revised: 05/25/2014 Document Reviewed: 06/27/2013 Elsevier Interactive Patient Education [employer].

## 2015-04-04 NOTE — ED Notes (Signed)
Secondary assessment completed and documented by PA

## 2015-04-04 NOTE — ED Notes (Signed)
Pt to department via [facility] with neck and back pain while cleaning her house. Pain increased about 20 minutes ago and then she called 911. Pt was ambulatory to truck. Pt takes hydrocodone on regular basis but is out. [id number]

## 2015-04-04 NOTE — ED Provider Notes (Signed)
CSN: [id number]   Arrival date & time 04/04/15 1243  History  By signing my name below, I, Janice Pena, attest that this documentation has been prepared under the direction and in the presence of [employer] PA-C Electronically Signed: Bethel Pena, ED Scribe. 04/04/2015. 2:34 PM. Chief Complaint  Patient presents with  . Neck Pain  . Back Pain    HPI Patient is a 29 y.o. female presenting with back pain. The history is provided by the patient. No language interpreter was used.  Back Pain Location:  Lumbar spine Quality: Throbbing/piercing. Radiates to:  Does not radiate Pain severity:  Severe Pain is:  Same all the time Onset quality:  Sudden Duration: Today. Timing:  Intermittent Progression:  Unchanged Chronicity:  Chronic Context: physical stress   Context comment:  Cleaning Relieved by:  Nothing Worsened by:  Movement Ineffective treatments:  None tried Associated symptoms: no abdominal pain, no abdominal swelling, no bladder incontinence, no bowel incontinence, no chest pain, no dysuria, no fever, no numbness, no paresthesias, no perianal numbness, no tingling and no weakness    Janice Pena is a 29 y.o. female with history of rheumatoid arthritis, who presents to the Emergency Department complaining of a chronic bilateral neck and lower back pain exacerbation with sudden onset today while cleaning. Pt describes the pain as intermittent, throbbing/piercing, 10/10 in severity, exacerbated by movement, and non-radiating. She took nothing for pain relief PTA. Pt denies new numbness or tingling, fever, chills, chest pain, SOB, abdominal pain, n/v/d/c, saddle anesthesia, incontinence of bowel or bladder, cauda equina symptoms, hematuria, dysuria. Pt states that she is supposed to be seen at [facility] for her arthritis. No history of DM.    Past Medical History  Diagnosis Date  . Asthma   . Anxiety   . HSV infection   . Bilateral ovarian cysts   . Left  wrist pain   . Asthma   . Anxiety   . GERD (gastroesophageal reflux disease)   . Vitamin D deficiency   . Rheumatoid arteritis     Past Surgical History  Procedure Laterality Date  . Abdominal hysterectomy      Family History  Problem Relation Age of Onset  . Hypertension Mother   . Hyperlipidemia Mother   . Asthma Mother   . Anxiety disorder Mother   . Depression Mother   . Hypertension Father   . Hyperlipidemia Father   . Asthma Father     Social History  Substance Use Topics  . Smoking status: Current Every Day Smoker -- 1.00 packs/day    Types: Cigarettes    Last Attempt to Quit: 06/03/2014  . Smokeless tobacco: None     Comment: 5 or less a day   . Alcohol Use: No     Review of Systems  Constitutional: Negative for fever and chills.  Respiratory: Negative for shortness of breath.   Cardiovascular: Negative for chest pain.  Gastrointestinal: Negative for nausea, vomiting, abdominal pain, diarrhea, constipation and bowel incontinence.  Genitourinary: Negative for bladder incontinence, dysuria and hematuria.       No incontinence  Musculoskeletal: Positive for back pain and neck pain.  Skin: Negative for color change.  Allergic/Immunologic: Negative for immunocompromised state.  Neurological: Negative for tingling, weakness, numbness and paresthesias.   10 Systems reviewed and all are negative for acute change except as noted in the HPI.   Home Medications   Prior to Admission medications   Medication Sig Start Date End Date Taking? Authorizing Provider  albuterol (PROVENTIL HFA;VENTOLIN HFA) 108 (90 BASE) MCG/ACT inhaler Inhale 1-2 puffs into the lungs every 6 (six) hours as needed for wheezing or shortness of breath (shortness of breath).    Historical Provider, MD  budesonide-formoterol (SYMBICORT) 80-4.5 MCG/ACT inhaler Inhale 2 puffs into the lungs 2 (two) times daily.    Historical Provider, MD  guaiFENesin (MUCINEX) 600 MG 12 hr tablet Take 2 tablets  (1,200 mg total) by mouth 2 (two) times daily. Patient not taking: Reported on 01/12/2015 12/14/14   Dahlia Client Muthersbaugh, PA-C  ibuprofen (ADVIL,MOTRIN) 800 MG tablet Take 1 tablet (800 mg total) by mouth 3 (three) times daily. Patient not taking: Reported on 12/13/2014 10/20/14   Ladona Mow, PA-C    Allergies  Lactose intolerance (gi); Other; and Oxycontin  Triage Vitals: BP 121/86 mmHg  Pulse 76  Temp(Src) 98.1 F (36.7 C) (Oral)  Resp 14  SpO2 100%  Physical Exam  Constitutional: She is oriented to person, place, and time. Vital signs are normal. She appears well-developed and well-nourished.  Non-toxic appearance. No distress.  Afebrile, nontoxic, NAD  HENT:  Head: Normocephalic and atraumatic.  Mouth/Throat: Mucous membranes are normal.  Eyes: Conjunctivae and EOM are normal. Right eye exhibits no discharge. Left eye exhibits no discharge.  Neck: Normal range of motion. Neck supple. Muscular tenderness present. No spinous process tenderness present. No rigidity. Normal range of motion present.  FROM intact without spinous process TTP, no bony stepoffs or deformities, with mild bilateral paraspinous muscle TTP and muscle spasms. No rigidity or meningeal signs. No bruising or swelling.   Cardiovascular: Normal rate.   Pulmonary/Chest: Effort normal. No respiratory distress.  Abdominal: Normal appearance. She exhibits no distension.  Musculoskeletal: Normal range of motion.       Lumbar back: She exhibits tenderness and spasm. She exhibits normal range of motion, no bony tenderness and no deformity.       Back:  Lumbar spine with FROM intact without spinous process TTP, no bony stepoffs or deformities, with mild b/l paraspinous muscle TTP and muscle spasms. Strength 5/5 in all extremities, sensation grossly intact in all extremities, negative SLR bilaterally, gait steady and nonantalgic. No overlying skin changes.   Neurological: She is alert and oriented to person, place, and time.  She has normal strength. No sensory deficit.  Skin: Skin is warm, dry and intact. No rash noted.  Psychiatric: She has a normal mood and affect. Her behavior is normal.  Nursing note and vitals reviewed.   ED Course  Procedures  DIAGNOSTIC STUDIES: Oxygen Saturation is 100% on RA,  normal by my interpretation.    COORDINATION OF CARE: 2:14 PM Discussed treatment plan which includes Toradol, Flexeril, and prednisine with pt at bedside and pt agreed to the plan.  Labs Review- Labs Reviewed - No data to display  Imaging Review No results found.  MDM   Final diagnoses:  Neck pain  Bilateral low back pain without sciatica  History of rheumatoid arthritis  Back muscle spasm    29 y.o. female here with acute on chronic neck/back pain. No red flag s/s of low back pain. No s/s of central cord compression or cauda equina. Lower extremities are neurovascularly intact and patient is ambulating without difficulty. Doubt need for imaging.  Patient was counseled on back pain precautions and told to do activity as tolerated but do not lift, push, or pull heavy objects more than 10 pounds for the next week. Patient counseled to use ice or heat on back for no  longer than 15 minutes every hour.   Rx given for muscle relaxer and counseled on proper use of muscle relaxant medication. Rx for ibuprofen and prednisone given as well. Urged patient not to drink alcohol, drive, or perform any other activities that requires focus while taking flexeril.   Patient urged to follow-up with PCP if pain does not improve with treatment and rest or if pain becomes recurrent. Urged to return with worsening severe pain, loss of bowel or bladder control, trouble walking. The patient verbalizes understanding and agrees with the plan.   I personally performed the services described in this documentation, which was scribed in my presence. The recorded information has been reviewed and is accurate.  BP 121/86 mmHg  Pulse  76  Temp(Src) 98.1 F (36.7 C) (Oral)  Resp 14  SpO2 100%  Meds ordered this encounter  Medications  . ketorolac (TORADOL) 30 MG/ML injection 30 mg    Sig:   . cyclobenzaprine (FLEXERIL) tablet 5 mg    Sig:   . predniSONE (DELTASONE) tablet 60 mg    Sig:   . ibuprofen (ADVIL,MOTRIN) 800 MG tablet    Sig: Take 1 tablet (800 mg total) by mouth every 8 (eight) hours as needed for mild pain or moderate pain.    Dispense:  30 tablet    Refill:  0    Order Specific Question:  Supervising Provider    Answer:  MILLER, BRIAN [[id number]]  . cyclobenzaprine (FLEXERIL) 10 MG tablet    Sig: Take 1 tablet (10 mg total) by mouth 3 (three) times daily as needed for muscle spasms.    Dispense:  15 tablet    Refill:  0    Order Specific Question:  Supervising Provider    Answer:  MILLER, BRIAN [[id number]]  . predniSONE (DELTASONE) 20 MG tablet    Sig: 3 tabs po daily x 3 days    Dispense:  9 tablet    Refill:  0    Order Specific Question:  Supervising Provider    Answer:  Angus Seller Camprubi-Soms, PA-C 04/04/15 1443  Linwood Dibbles, MD 04/05/15 [phone]

## 2016-01-03 IMAGING — CR DG ANKLE COMPLETE 3+V*R*
3 series · 3 of 3 positions shown · non-contrast
Comparison: Same day foot radiographs

CLINICAL DATA: Right foot and ankle bending injury, pain.

EXAM:
RIGHT ANKLE - COMPLETE 3+ VIEW

[x ankle ap right]
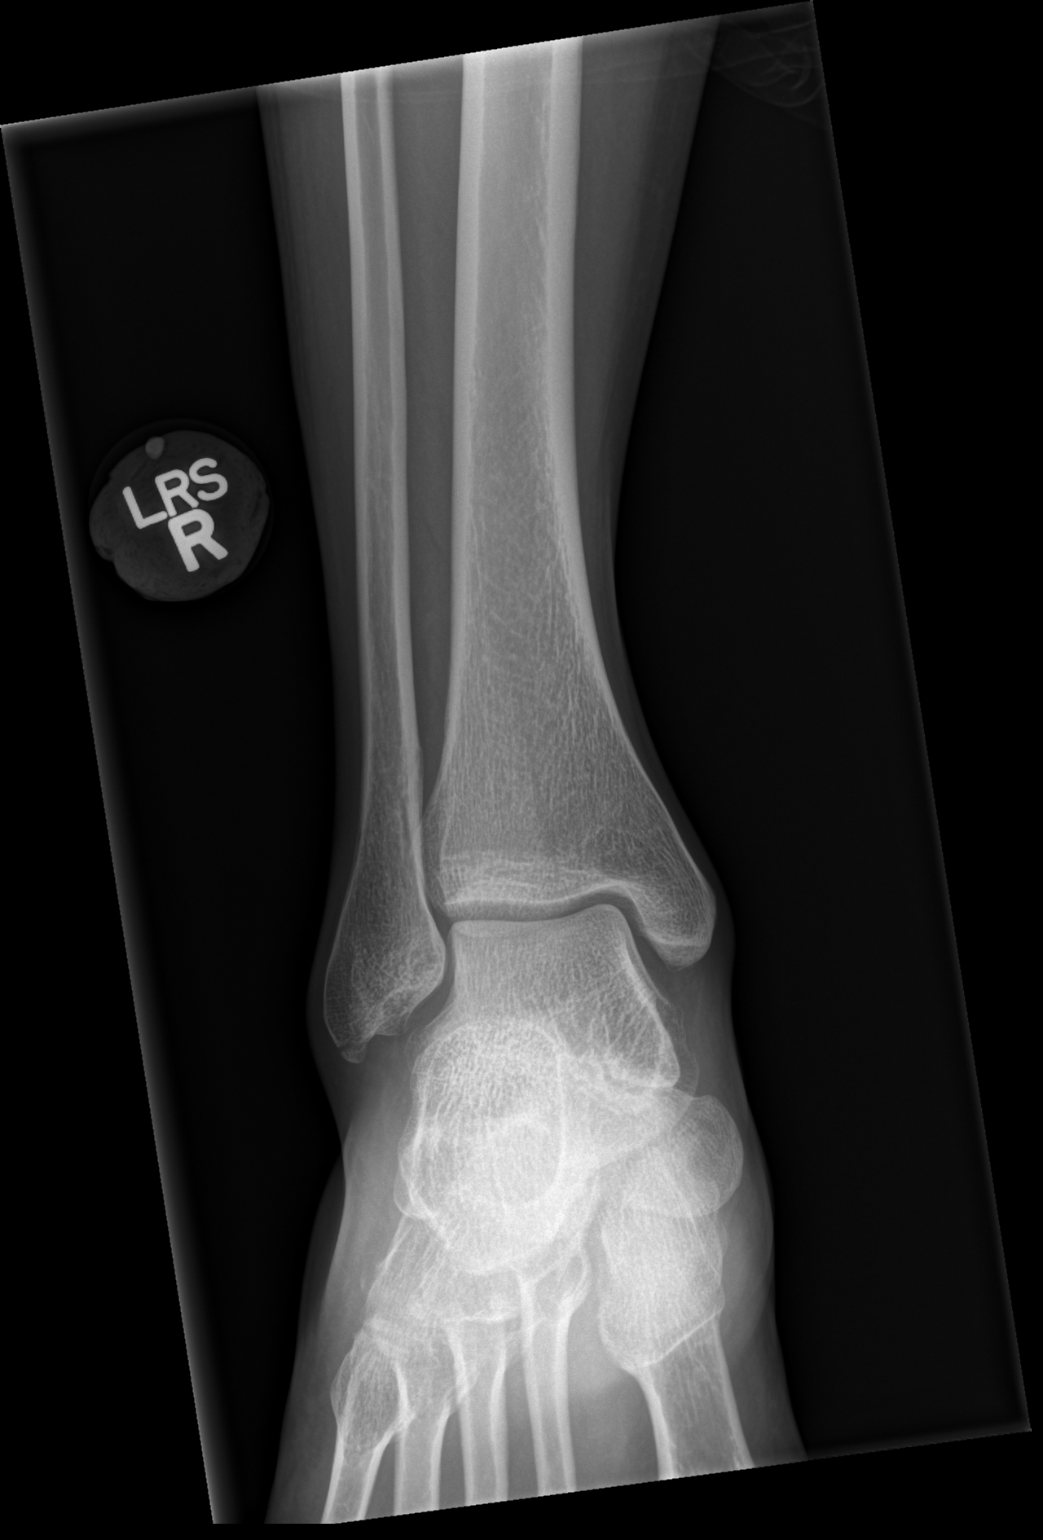

[x ankle obl right]
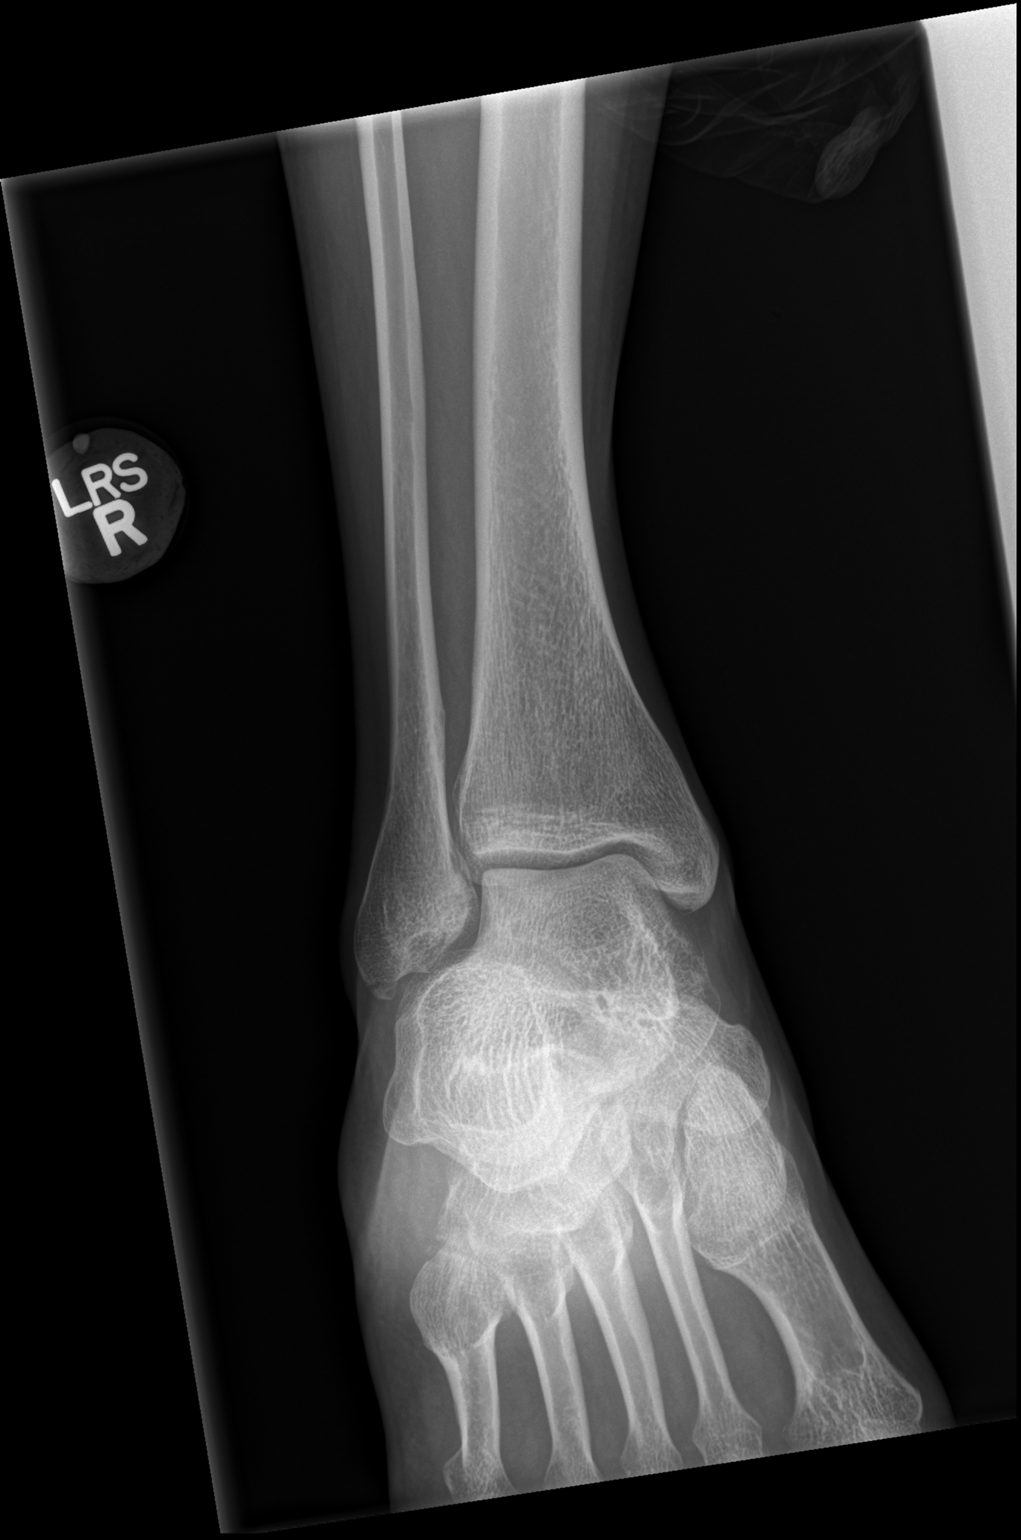

[x ankle lat right]
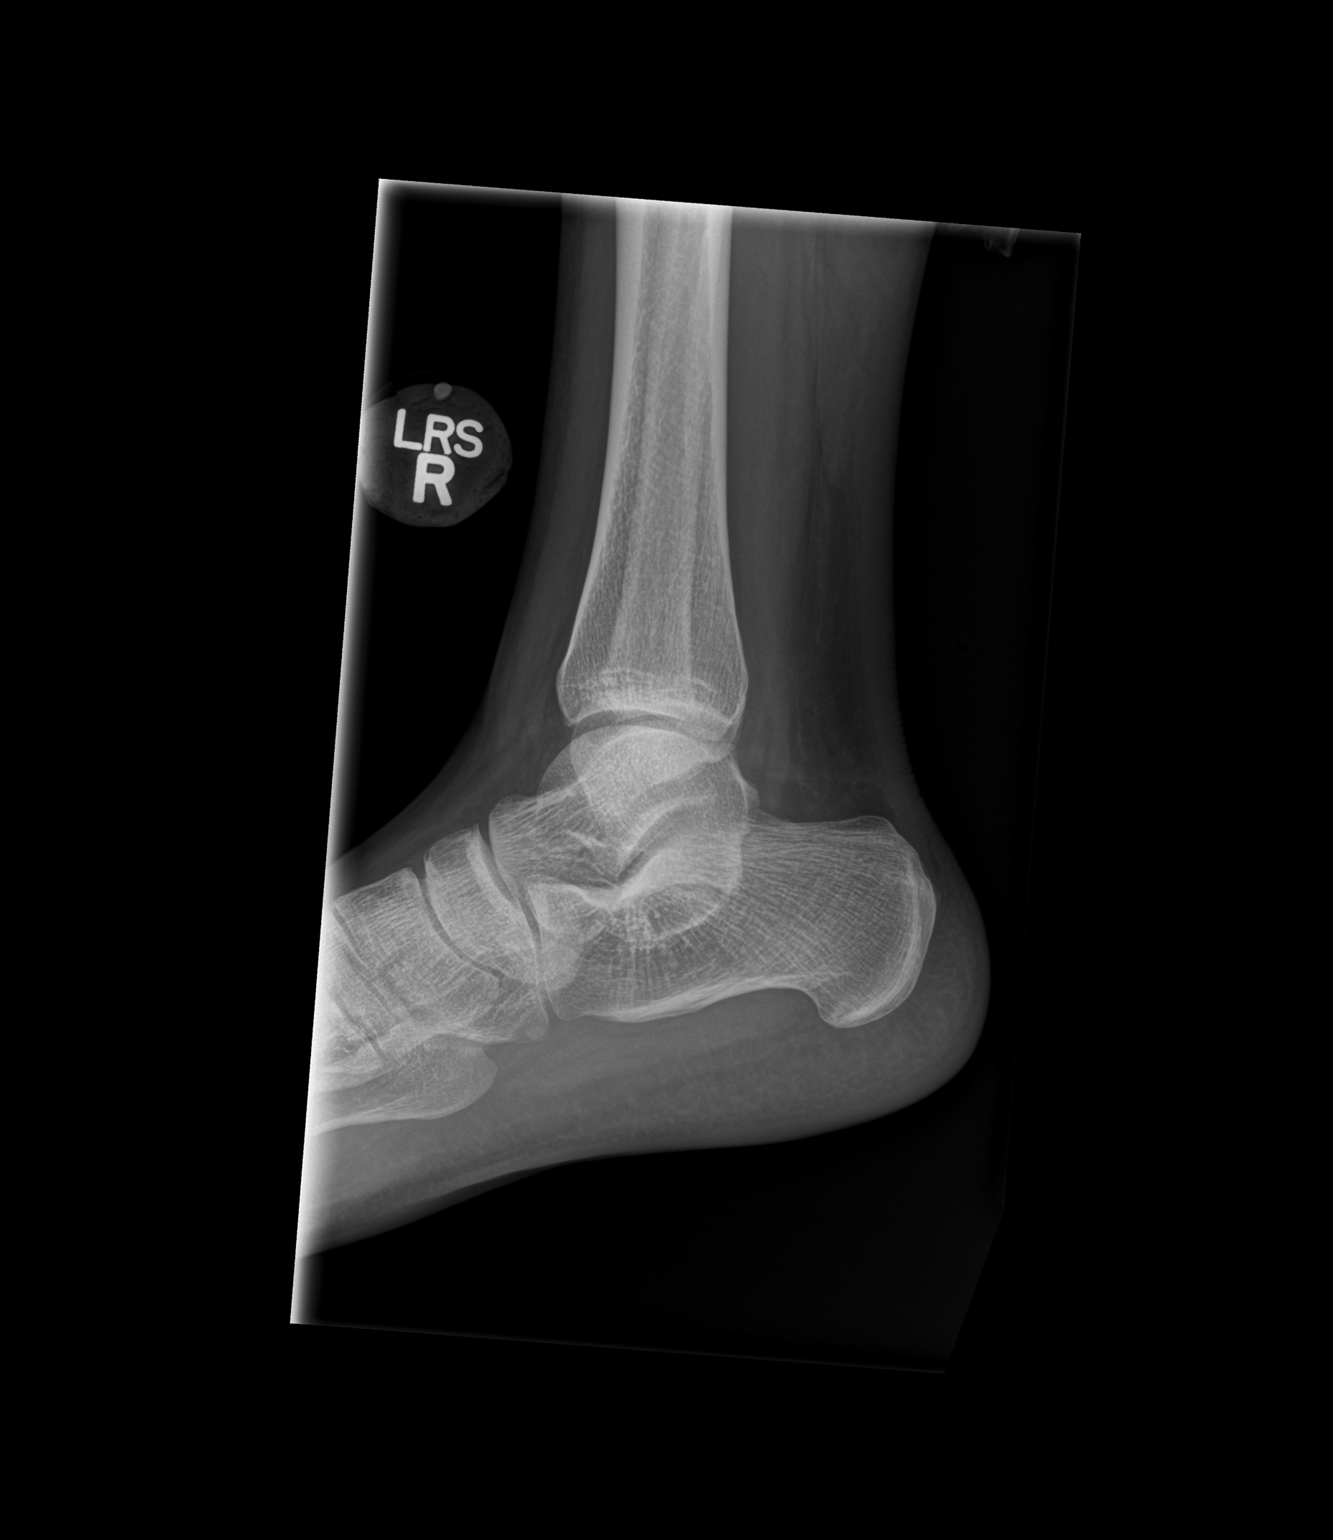

[3 of 3 positions shown; findings below may reference images not displayed]

FINDINGS: Transverse fracture through the tip of the lateral malleolus. No
ankle mortise widening. No additional fracture. No dislocation.
Slight lateral soft tissue swelling
IMPRESSION: Mildly displaced transverse fracture through the tip of the lateral
malleolus.

## 2016-07-20 ENCOUNTER — Encounter (HOSPITAL_COMMUNITY): Payer: Self-pay | Admitting: Emergency Medicine

## 2016-07-20 ENCOUNTER — Emergency Department (HOSPITAL_COMMUNITY): Payer: Self-pay

## 2016-07-20 ENCOUNTER — Emergency Department (HOSPITAL_COMMUNITY)
Admission: EM | Admit: 2016-07-20 | Discharge: 2016-07-20 | Disposition: A | Discharge status: Home / Self Care | Payer: Self-pay | Attending: Emergency Medicine | Admitting: Emergency Medicine

## 2016-07-20 DIAGNOSIS — J069 Acute upper respiratory infection, unspecified: Secondary | ICD-10-CM

## 2016-07-20 DIAGNOSIS — J4521 Mild intermittent asthma with (acute) exacerbation: Secondary | ICD-10-CM | POA: Insufficient documentation

## 2016-07-20 DIAGNOSIS — B9789 Other viral agents as the cause of diseases classified elsewhere: Secondary | ICD-10-CM

## 2016-07-20 DIAGNOSIS — F17210 Nicotine dependence, cigarettes, uncomplicated: Secondary | ICD-10-CM | POA: Insufficient documentation

## 2016-07-20 MED ORDER — PREDNISONE 20 MG PO TABS
60.0000 mg | ORAL_TABLET | Freq: Once | ORAL | Status: AC
  Administered 2016-07-20: 60 mg via ORAL
  Filled 2016-07-20: qty 3

## 2016-07-20 MED ORDER — ALBUTEROL SULFATE (2.5 MG/3ML) 0.083% IN NEBU: 2.5000 mg | INHALATION_SOLUTION | Freq: Four times a day (QID) | RESPIRATORY_TRACT | 0 refills | Status: AC | PRN

## 2016-07-20 MED ORDER — BENZONATATE 100 MG PO CAPS: 100.0000 mg | ORAL_CAPSULE | Freq: Three times a day (TID) | ORAL | 0 refills | Status: DC

## 2016-07-20 MED ORDER — ALBUTEROL SULFATE HFA 108 (90 BASE) MCG/ACT IN AERS: 1.0000 | INHALATION_SPRAY | Freq: Four times a day (QID) | RESPIRATORY_TRACT | 0 refills | Status: DC | PRN

## 2016-07-20 MED ORDER — PREDNISONE 20 MG PO TABS: 20.0000 mg | ORAL_TABLET | Freq: Two times a day (BID) | ORAL | 0 refills | Status: DC

## 2016-07-20 MED ORDER — BENZONATATE 100 MG PO CAPS
200.0000 mg | ORAL_CAPSULE | Freq: Once | ORAL | Status: AC
  Administered 2016-07-20: 200 mg via ORAL
  Filled 2016-07-20: qty 2

## 2016-07-20 NOTE — Discharge Planning (Signed)
Na Waldrip J. Lucretia Roers, RN, BSN, [place] [phone]  [facility] set up appointment with Joaquin Courts, NP on 3/28 @ 1030.  Spoke with pt at bedside and advised to please arrive 15 min early and take a picture ID and your current medications.  Pt verbalizes understanding of keeping appointment.

## 2016-07-20 NOTE — ED Triage Notes (Signed)
Pt sts cough with blood tinged sputum and pain with cough

## 2016-07-20 NOTE — ED Provider Notes (Signed)
[facility]-EMERGENCY DEPT Provider Note   CSN: [id number] Arrival date & time: 07/20/16  1205   By signing my name below, I, Clarisse Gouge, attest that this documentation has been prepared under the direction and in the presence of Rolland Porter, MD. Electronically signed, Clarisse Gouge, ED Scribe. 07/20/16. 1:08 PM.   History   Chief Complaint Chief Complaint  Patient presents with  . Cough   The history is provided by the patient and medical records. No language interpreter was used.    HPI Comments: Janice Pena is a 31 y.o. female with Hx of asthma who presents to the Emergency Department complaining of chest painn since waking today. She notes associated worsening SOB x 1 week, productive cough with bloody sputum and tremors. Pt states she has used her albuterol inhaler without relief. She adds she is out of albuterol at home. Pt is reportedly a smoker. She denies chills, fever, sinus pressure, rhinorrhea, congestion and sore throat  Past Medical History:  Diagnosis Date  . Anxiety   . Anxiety   . Asthma   . Asthma   . Bilateral ovarian cysts   . GERD (gastroesophageal reflux disease)   . HSV infection   . Left wrist pain   . Rheumatoid arteritis   . Vitamin D deficiency     Patient Active Problem List   Diagnosis Date Noted  . S/P hysterectomy for AUB 03/28/2014  . Ovarian cyst 03/28/2014    Past Surgical History:  Procedure Laterality Date  . ABDOMINAL HYSTERECTOMY      OB History    Gravida Para Term Preterm AB Living   2 2       2    SAB TAB Ectopic Multiple Live Births                   Home Medications    Prior to Admission medications   Medication Sig Start Date End Date Taking? Authorizing Provider  budesonide-formoterol (SYMBICORT) 80-4.5 MCG/ACT inhaler Inhale 2 puffs into the lungs 2 (two) times daily.   Yes Historical Provider, MD  albuterol (PROVENTIL HFA;VENTOLIN HFA) 108 (90 Base) MCG/ACT inhaler Inhale 1-2 puffs into the lungs every 6 (six)  hours as needed for wheezing. 07/20/16   Rolland Porter, MD  albuterol (PROVENTIL) (2.5 MG/3ML) 0.083% nebulizer solution Take 3 mLs (2.5 mg total) by nebulization every 6 (six) hours as needed for wheezing or shortness of breath. 07/20/16   Rolland Porter, MD  benzonatate (TESSALON) 100 MG capsule Take 1 capsule (100 mg total) by mouth every 8 (eight) hours. 07/20/16   Rolland Porter, MD  guaiFENesin (MUCINEX) 600 MG 12 hr tablet Take 2 tablets (1,200 mg total) by mouth 2 (two) times daily. Patient not taking: Reported on 01/12/2015 12/14/14   Dahlia Client Muthersbaugh, PA-C  ibuprofen (ADVIL,MOTRIN) 800 MG tablet Take 1 tablet (800 mg total) by mouth 3 (three) times daily. Patient not taking: Reported on 12/13/2014 10/20/14   Ladona Mow, PA-C  ibuprofen (ADVIL,MOTRIN) 800 MG tablet Take 1 tablet (800 mg total) by mouth every 8 (eight) hours as needed for mild pain or moderate pain. Patient not taking: Reported on 07/20/2016 04/04/15   Mercedes Street, PA-C  predniSONE (DELTASONE) 20 MG tablet Take 1 tablet (20 mg total) by mouth 2 (two) times daily with a meal. 07/20/16   Rolland Porter, MD    Family History Family History  Problem Relation Age of Onset  . Hypertension Mother   . Hyperlipidemia Mother   . Asthma Mother   .  Anxiety disorder Mother   . Depression Mother   . Hypertension Father   . Hyperlipidemia Father   . Asthma Father     Social History Social History  Substance Use Topics  . Smoking status: Current Every Day Smoker    Packs/day: 1.00    Types: Cigarettes    Last attempt to quit: 06/03/2014  . Smokeless tobacco: Not on file     Comment: 5 or less a day   . Alcohol use No     Allergies   Lactose intolerance (gi); Other; and Oxycontin [oxycodone hcl]   Review of Systems Review of Systems  Constitutional: Negative for appetite change, diaphoresis, fatigue and fever.  HENT: Negative for congestion, mouth sores, sinus pain, sinus pressure and trouble swallowing.   Eyes: Negative for visual  disturbance.  Respiratory: Positive for chest tightness.   Gastrointestinal: Negative for abdominal distention, abdominal pain, diarrhea, nausea and vomiting.  Endocrine: Negative for polydipsia, polyphagia and polyuria.  Genitourinary: Negative for dysuria, frequency and hematuria.  Musculoskeletal: Negative for gait problem.  Skin: Negative for color change, pallor and rash.  Neurological: Negative for dizziness, syncope and light-headedness.  Hematological: Does not bruise/bleed easily.  Psychiatric/Behavioral: Negative for behavioral problems and confusion.     Physical Exam Updated Vital Signs BP (!) 150/107 (BP Location: Left Arm)   Pulse 61   Temp 97.7 F (36.5 C) (Oral)   Resp 17   Ht 5\' 5"  (1.651 m)   Wt 160 lb (72.6 kg)   SpO2 100%   BMI 26.63 kg/m   Physical Exam  Constitutional: She is oriented to person, place, and time. She appears well-developed and well-nourished. No distress.  HENT:  Head: Normocephalic.  Eyes: Conjunctivae are normal. Pupils are equal, round, and reactive to light. No scleral icterus.  Neck: Normal range of motion. Neck supple. No thyromegaly present.  Cardiovascular: Normal rate and regular rhythm.  Exam reveals no gallop and no friction rub.   No murmur heard. Pulmonary/Chest: Effort normal. No respiratory distress. She has wheezes (faint end expiratory). She has no rales.  Abdominal: Soft. Bowel sounds are normal. She exhibits no distension. There is no tenderness. There is no rebound.  Musculoskeletal: Normal range of motion.  Neurological: She is alert and oriented to person, place, and time.  Skin: Skin is warm and dry. No rash noted.  Psychiatric: She has a normal mood and affect. Her behavior is normal.     ED Treatments / Results  DIAGNOSTIC STUDIES: Oxygen Saturation is 100% on RA, normal by my interpretation.    COORDINATION OF CARE: 1:06 PM Discussed treatment plan with pt at bedside and pt agreed to plan. Will order  imaging and medications.  Labs (all labs ordered are listed, but only abnormal results are displayed) Labs Reviewed - No data to display  EKG  EKG Interpretation None       Radiology Dg Chest 2 View  Result Date: 07/20/2016 CLINICAL DATA:  Central chest pain. Shortness of breath and cough for 1 week. History of asthma. EXAM: CHEST  2 VIEW COMPARISON:  11/29/2014 and 12/14/2011 radiographs. FINDINGS: The heart size and mediastinal contours are normal. The lungs are clear. There is no pleural effusion or pneumothorax. No acute osseous findings are identified. IMPRESSION: Stable chest.  No acute cardiopulmonary process. Electronically Signed   By: 12/16/2011 M.D.   On: 07/20/2016 13:30    Procedures Procedures (including critical care time)  Medications Ordered in ED Medications  predniSONE (DELTASONE) tablet 60  mg (not administered)  benzonatate (TESSALON) capsule 200 mg (not administered)     Initial Impression / Assessment and Plan / ED Course  I have reviewed the triage vital signs and the nursing notes.  Pertinent labs & imaging results that were available during my care of the patient were reviewed by me and considered in my medical decision making (see chart for details).     Well oxygenated. Normal x-ray. Plan will be treatment with prednisone for her asthma exacerbation. Tessalon for cough. Likely viral upper respiratory infection.  . No risks for pulmonary embolus other than smoking. She is not tachycardic or hypoxemic and has no discomfort to the chest.  Final Clinical Impressions(s) / ED Diagnoses   Final diagnoses:  Viral URI with cough  Mild intermittent asthma with exacerbation    New Prescriptions New Prescriptions   ALBUTEROL (PROVENTIL HFA;VENTOLIN HFA) 108 (90 BASE) MCG/ACT INHALER    Inhale 1-2 puffs into the lungs every 6 (six) hours as needed for wheezing.   ALBUTEROL (PROVENTIL) (2.5 MG/3ML) 0.083% NEBULIZER SOLUTION    Take 3 mLs (2.5 mg  total) by nebulization every 6 (six) hours as needed for wheezing or shortness of breath.   BENZONATATE (TESSALON) 100 MG CAPSULE    Take 1 capsule (100 mg total) by mouth every 8 (eight) hours.   PREDNISONE (DELTASONE) 20 MG TABLET    Take 1 tablet (20 mg total) by mouth 2 (two) times daily with a meal.     Rolland Porter, MD 07/20/16 1336

## 2016-07-20 NOTE — Discharge Instructions (Signed)
Stop smoking

## 2016-08-12 ENCOUNTER — Ambulatory Visit: Payer: Self-pay | Admitting: Family Medicine

## 2017-02-10 ENCOUNTER — Emergency Department (HOSPITAL_COMMUNITY)
Admission: EM | Admit: 2017-02-10 | Discharge: 2017-02-10 | Disposition: A | Discharge status: Home / Self Care | Payer: Medicaid Other | Attending: Emergency Medicine | Admitting: Emergency Medicine

## 2017-02-10 ENCOUNTER — Encounter (HOSPITAL_COMMUNITY): Payer: Self-pay | Admitting: Emergency Medicine

## 2017-02-10 ENCOUNTER — Emergency Department (HOSPITAL_COMMUNITY): Payer: Medicaid Other

## 2017-02-10 DIAGNOSIS — J069 Acute upper respiratory infection, unspecified: Secondary | ICD-10-CM | POA: Insufficient documentation

## 2017-02-10 DIAGNOSIS — B349 Viral infection, unspecified: Secondary | ICD-10-CM | POA: Diagnosis not present

## 2017-02-10 DIAGNOSIS — F17210 Nicotine dependence, cigarettes, uncomplicated: Secondary | ICD-10-CM | POA: Insufficient documentation

## 2017-02-10 DIAGNOSIS — J45909 Unspecified asthma, uncomplicated: Secondary | ICD-10-CM | POA: Insufficient documentation

## 2017-02-10 DIAGNOSIS — Z79899 Other long term (current) drug therapy: Secondary | ICD-10-CM | POA: Insufficient documentation

## 2017-02-10 DIAGNOSIS — R05 Cough: Secondary | ICD-10-CM | POA: Diagnosis present

## 2017-02-10 MED ORDER — LORATADINE 10 MG PO TABS: 10.0000 mg | ORAL_TABLET | Freq: Every day | ORAL | 0 refills | Status: DC

## 2017-02-10 NOTE — Discharge Instructions (Signed)
Chest x-ray was clear today, no evidence of pneumonia.  Her blood pressure was slightly elevated today in the ER. Please get this rechecked her primary care doctor's office.  You have a viral upper respiratory tract infection. Please continue to take albuterol inhaler as needed for shortness of breath and wheezing. Also I've written a prescription for loratadine which is an allergy medicine which may help improve her symptoms. You can also take an over-the-counter decongestant for your symptoms. You do not need to take prednisone.  Please return to the emergency department if he gets shortness of breath that does not improve with albuterol inhaler or for any new or worsening symptoms.

## 2017-02-10 NOTE — ED Provider Notes (Signed)
[facility]-EMERGENCY DEPT Provider Note   CSN: [id number] Arrival date & time: 02/10/17  0755     History   Chief Complaint Chief Complaint  Patient presents with  . URI    HPI Janice Pena is a 31 y.o. female.  HPI   Janice Pena is a 31yo Female with a past medical history of asthma, rheumatoid arthritis, who presents to the emergency department for evaluation of upper respiratory tract infection. Patient states that for the past 3 weeks she has had cough, sore throat, congestion. She states that she had been put on a steroid taper for a recent rheumatoid arthritis flare. States that the prednisone made her nauseous, and stopped taking the prednisone 2 days ago. She states that she was on 20 mg of prednisone per day at that time. States that she is feeling better and less nauseated being off the prednisone. Her cough is productive with a yellow sputum. She states that she has had to use her albuterol inhaler twice a day for the past 3 weeks. Albuterol improves her cough and shortness of breath for a short period of time. She has not used any other over-the-counter medicines for her symptoms. She states that she also has a sore throat, no trouble swallowing or voice change. She has a son at home who is sick with similar symptoms. Patient denies fever, abdominal pain, nausea/vomiting, ear pain.  Past Medical History:  Diagnosis Date  . Anxiety   . Anxiety   . Asthma   . Asthma   . Bilateral ovarian cysts   . GERD (gastroesophageal reflux disease)   . HSV infection   . Left wrist pain   . Rheumatoid arteritis   . Vitamin D deficiency     Patient Active Problem List   Diagnosis Date Noted  . S/P hysterectomy for AUB 03/28/2014  . Ovarian cyst 03/28/2014    Past Surgical History:  Procedure Laterality Date  . ABDOMINAL HYSTERECTOMY      OB History    Gravida Para Term Preterm AB Living   2 2       2    SAB TAB Ectopic Multiple Live Births                   Home  Medications    Prior to Admission medications   Medication Sig Start Date End Date Taking? Authorizing Provider  albuterol (PROVENTIL HFA;VENTOLIN HFA) 108 (90 Base) MCG/ACT inhaler Inhale 1-2 puffs into the lungs every 6 (six) hours as needed for wheezing. 07/20/16   Rolland Porter, MD  albuterol (PROVENTIL) (2.5 MG/3ML) 0.083% nebulizer solution Take 3 mLs (2.5 mg total) by nebulization every 6 (six) hours as needed for wheezing or shortness of breath. 07/20/16   Rolland Porter, MD  benzonatate (TESSALON) 100 MG capsule Take 1 capsule (100 mg total) by mouth every 8 (eight) hours. 07/20/16   Rolland Porter, MD  budesonide-formoterol [facility]) 80-4.5 MCG/ACT inhaler Inhale 2 puffs into the lungs 2 (two) times daily.    [provider]  guaiFENesin (MUCINEX) 600 MG 12 hr tablet Take 2 tablets (1,200 mg total) by mouth 2 (two) times daily. Patient not taking: Reported on 01/12/2015 12/14/14   Muthersbaugh, Dahlia Client, PA-C  ibuprofen (ADVIL,MOTRIN) 800 MG tablet Take 1 tablet (800 mg total) by mouth 3 (three) times daily. Patient not taking: Reported on 12/13/2014 10/20/14   Ladona Mow, PA-C  ibuprofen (ADVIL,MOTRIN) 800 MG tablet Take 1 tablet (800 mg total) by mouth every 8 (eight) hours as needed  for mild pain or moderate pain. Patient not taking: Reported on 07/20/2016 04/04/15   Street, [place], PA-C  loratadine (CLARITIN) 10 MG tablet Take 1 tablet (10 mg total) by mouth daily. 02/10/17   Kellie Shropshire, PA-C  predniSONE (DELTASONE) 20 MG tablet Take 1 tablet (20 mg total) by mouth 2 (two) times daily with a meal. 07/20/16   Rolland Porter, MD    Family History Family History  Problem Relation Age of Onset  . Hypertension Mother   . Hyperlipidemia Mother   . Asthma Mother   . Anxiety disorder Mother   . Depression Mother   . Hypertension Father   . Hyperlipidemia Father   . Asthma Father     Social History Social History  Substance Use Topics  . Smoking status: Current Every Day Smoker     Packs/day: 1.00    Types: Cigarettes    Last attempt to quit: 06/03/2014  . Smokeless tobacco: Not on file     Comment: 5 or less a day   . Alcohol use No     Allergies   Lactose intolerance (gi); Other; and Oxycontin [oxycodone hcl]   Review of Systems Review of Systems  Constitutional: Positive for appetite change (low). Negative for chills and fever.  HENT: Positive for congestion, postnasal drip, rhinorrhea and sore throat. Negative for ear pain, mouth sores and sinus pain.   Eyes: Negative for visual disturbance.  Respiratory: Positive for cough. Negative for shortness of breath and wheezing.   Cardiovascular: Negative for chest pain and palpitations.  Gastrointestinal: Negative for abdominal pain and vomiting.  Skin: Negative for rash.  Neurological: Negative for headaches.     Physical Exam Updated Vital Signs BP (!) 152/105   Pulse 84   Temp 98.3 F (36.8 C) (Oral)   Resp 16   SpO2 100%   Physical Exam  Constitutional: She is oriented to person, place, and time. She appears well-developed and well-nourished. No distress.  HENT:  Head: Normocephalic and atraumatic.  Right Ear: External ear normal.  Left Ear: External ear normal.  Mouth/Throat: No oropharyngeal exudate.  TMs with good cone of light. Clear rhinorrhea noted in the nasal cavity.  Eyes: Pupils are equal, round, and reactive to light. Conjunctivae are normal. Right eye exhibits no discharge. Left eye exhibits no discharge.  Neck: Normal range of motion. Neck supple.  Cervical adenopathy which is grossly tender to palpation.  Cardiovascular: Normal rate and regular rhythm.  Exam reveals no friction rub.   No murmur heard. Pulmonary/Chest: Effort normal. No respiratory distress. She has no wheezes.  Inspiratory rhonchi heard in upper and lower lung fields bilaterally. Good air movement. No accessory muscle use, no tachypnea. No respiratory distress.  Abdominal: Soft. Bowel sounds are normal. There is  no tenderness.  Neurological: She is alert and oriented to person, place, and time. Coordination normal.  Skin: She is not diaphoretic.  Psychiatric: She has a normal mood and affect. Her behavior is normal.  Nursing note and vitals reviewed.    ED Treatments / Results  Labs (all labs ordered are listed, but only abnormal results are displayed) Labs Reviewed - No data to display  EKG  EKG Interpretation None       Radiology Dg Chest 2 View  Result Date: 02/10/2017 CLINICAL DATA:  Cough, fever, runny nose for the past 3 weeks with occasional nausea and anorexia. History of asthma and rheumatoid arthritis. EXAM: CHEST  2 VIEW COMPARISON:  Chest x-ray of July 20, 2016  FINDINGS: The lungs are mildly hyperinflated and clear. The heart and mediastinal structures are normal. There is no pleural effusion. The bony thorax is unremarkable. IMPRESSION: Mild hyperinflation consistent with known reactive airway disease. There is no acute cardiopulmonary abnormality. Electronically Signed   By: David  [place] M.D.   On: 02/10/2017 08:54    Procedures Procedures (including critical care time)  Medications Ordered in ED Medications - No data to display   Initial Impression / Assessment and Plan / ED Course  I have reviewed the triage vital signs and the nursing notes.  Pertinent labs & imaging results that were available during my care of the patient were reviewed by me and considered in my medical decision making (see chart for details).     Pt CXR negative for acute infiltrate. Patients symptoms are consistent with URI, likely viral etiology. Discussed that antibiotics are not indicated for viral infections. Pt will be discharged with symptomatic treatment. Patient states that she has enough albuterol, does not need a refill. I also started her on a trial of allergy medicine. Patient counseled to return to her primary care doctor's office if her symptoms are not improved in a week. Discussed  strict return precautions.  We also discussed that patient's blood pressure was elevated in the ER today, she can have this rechecked at her primary care doctor's office. Verbalizes understanding and is agreeable with plan. Pt is hemodynamically stable & in NAD prior to dc.   Final Clinical Impressions(s) / ED Diagnoses   Final diagnoses:  Viral upper respiratory tract infection    New Prescriptions New Prescriptions   LORATADINE (CLARITIN) 10 MG TABLET    Take 1 tablet (10 mg total) by mouth daily.     Kellie Shropshire, PA-C 02/10/17 0944    Kellie Shropshire, PA-C 02/10/17 0945    Tegeler, Canary Brim, MD 02/10/17 2034

## 2017-02-10 NOTE — ED Triage Notes (Signed)
Pt reports productive cough, sore throat, runny nose and chills for the past 3 weeks, states she went to her doctor and was given prednisone and albuterol, no relief from meds, pt a/ox4, resp e/u, nad.

## 2017-04-20 ENCOUNTER — Ambulatory Visit: Payer: Medicaid Other | Admitting: Internal Medicine

## 2017-04-20 ENCOUNTER — Other Ambulatory Visit (INDEPENDENT_AMBULATORY_CARE_PROVIDER_SITE_OTHER): Payer: Medicaid Other

## 2017-04-20 ENCOUNTER — Encounter: Payer: Self-pay | Admitting: Internal Medicine

## 2017-04-20 VITALS — BP 122/62 | HR 97 | Ht 65.0 in | Wt 146.4 lb

## 2017-04-20 DIAGNOSIS — R0602 Shortness of breath: Secondary | ICD-10-CM

## 2017-04-20 DIAGNOSIS — R0781 Pleurodynia: Secondary | ICD-10-CM

## 2017-04-20 DIAGNOSIS — J302 Other seasonal allergic rhinitis: Secondary | ICD-10-CM | POA: Diagnosis not present

## 2017-04-20 DIAGNOSIS — R053 Chronic cough: Secondary | ICD-10-CM

## 2017-04-20 DIAGNOSIS — R05 Cough: Secondary | ICD-10-CM | POA: Diagnosis not present

## 2017-04-20 LAB — CBC WITH DIFFERENTIAL/PLATELET
Basophils Absolute: 0 10*3/uL (ref 0.0–0.1)
Basophils Relative: 0.5 % (ref 0.0–3.0)
Eosinophils Absolute: 0.1 10*3/uL (ref 0.0–0.7)
Eosinophils Relative: 1.4 % (ref 0.0–5.0)
HCT: 45.4 % (ref 36.0–46.0)
Hemoglobin: 15.3 g/dL — ABNORMAL HIGH (ref 12.0–15.0)
Lymphocytes Relative: 29.9 % (ref 12.0–46.0)
Lymphs Abs: 2.3 10*3/uL (ref 0.7–4.0)
MCHC: 33.7 g/dL (ref 30.0–36.0)
MCV: 85.3 fl (ref 78.0–100.0)
Monocytes Absolute: 0.4 10*3/uL (ref 0.1–1.0)
Monocytes Relative: 5.3 % (ref 3.0–12.0)
Neutro Abs: 4.8 10*3/uL (ref 1.4–7.7)
Neutrophils Relative %: 62.9 % (ref 43.0–77.0)
Platelets: 316 10*3/uL (ref 150.0–400.0)
RBC: 5.32 Mil/uL — ABNORMAL HIGH (ref 3.87–5.11)
RDW: 13.4 % (ref 11.5–15.5)
WBC: 7.6 10*3/uL (ref 4.0–10.5)

## 2017-04-20 LAB — NITRIC OXIDE: Nitric Oxide: 5

## 2017-04-20 NOTE — Progress Notes (Signed)
Subjective:    Patient ID: Janice Pena, female    DOB: [birth year], 31 y.o.   MRN: [id number] PCP Jackie Plum, MD  HPI   IOV 04/20/2017  Chief Complaint  Patient presents with  . Advice Only    Referred by Dr. Julio Sicks. Pt states that she has rheumatoid arthritis and was prescribed prednisone also to see if it could help with asthma. States that she wakes up with a bad cough with clear to thick white mucus. Also states that she gets SOB and CP.  Pt does have pain in the lower left of her lung.    31 year old female.  Presents with her friend [name].  She is here for cough shortness of breath and left-sided infra-axillary pain.  She is a smoker  Patient is a good historian but talks a lot.  She tells me that in 2014 she was diagnosed with rheumatoid arthritis.  Was being followed at [facility].  Initially maintained on nonsteroidal anti-inflammatory drugs.  Seen in 2017 and then did not follow-up.  She went back to the rheumatologist Dr. Victorino Dike in September 2018 because of worsening joint pain.  Review of the chart shows that she had normal hepatitis serology, CBC and chemistry but an extremely low vitamin D level of 13.  She had normal CCP and also liver function test in September 2018.  Methotrexate was it was recommended but she has not started this because of ongoing shortness of breath cough and left sided infra-axillary chest pain.  These respiratory symptoms have been going on for 4 months and getting worse.  There is severe and present day and night.  No clear-cut aggravating or relieving factors.  At one time she said smoking makes her symptoms worse.  At another time she said smoking makes her symptoms better.  There is associated anxiety.  Denies any marijuana, cocaine or heroin or any other substance abuse.  She is concerned that she has asthma.  On her asthma control questionnaire she said she is waking up in the middle of the night great many times because  of shortness of breath and she has severe symptoms when she wakes up.  She finds activities limited.  She is also experiencing a very great deal of shortness of breath and wheezing most of the time and using albuterol for rescue at least couple of times daily.  The average five-point asthma score is 5 showing significant amount of symptoms.  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 The  exam nitric oxide is normal at 5 ppb.  Last prednisone was in September 2018 and this was a short course given for joint symptoms.  Of note she had a wrist x-ray in 2018 that did not show any erosions. She also claims spring and seasonal allergies    Dr Gretta Cool Reflux Symptom Index (> 13-15 suggestive of LPR cough) 04/20/2017 feno - 5 ppb  Hoarseness of problem with voice 3  Clearing  Of Throat 4  Excess throat mucus or feeling of post nasal drip 5  Difficulty swallowing food, liquid or tablets 2  Cough after eating or lying down 5  Breathing difficulties or choking episodes 3  Troublesome or annoying cough 5  Sensation of something sticking in throat or lump in throat 4  Heartburn, chest pain, indigestion, or stomach acid coming up 5  TOTAL 36     + 2011 CT chest and 2018 September chest x-ray that I personally visualized are  clear.  The chest x-ray in 2018 shows hyperinflation.  In 2012 - normal D Dimer   has a past medical history of Anxiety, Anxiety, Asthma, Asthma, Bilateral ovarian cysts, GERD (gastroesophageal reflux disease), HSV infection, Left wrist pain, Rheumatoid arteritis, and Vitamin D deficiency.   reports that she has been smoking cigarettes.  She started smoking about 15 years ago. She has a 13.00 pack-year smoking history. she has never used smokeless tobacco.  Past Surgical History:  Procedure Laterality Date  . ABDOMINAL HYSTERECTOMY      Allergies  Allergen Reactions  . Lactose Intolerance (Gi) Diarrhea  . Other     Grass - giver her a rash  . Oxycontin [Oxycodone  Hcl] Hives     There is no immunization history on file for this patient.  Family History  Problem Relation Age of Onset  . Hypertension Mother   . Hyperlipidemia Mother   . Asthma Mother   . Anxiety disorder Mother   . Depression Mother   . Hypertension Father   . Hyperlipidemia Father   . Asthma Father      Current Outpatient Medications:  .  albuterol (PROVENTIL HFA;VENTOLIN HFA) 108 (90 Base) MCG/ACT inhaler, Inhale 1-2 puffs into the lungs every 6 (six) hours as needed for wheezing., Disp: 1 Inhaler, Rfl: 0 .  albuterol (PROVENTIL) (2.5 MG/3ML) 0.083% nebulizer solution, Take 3 mLs (2.5 mg total) by nebulization every 6 (six) hours as needed for wheezing or shortness of breath., Disp: 75 mL, Rfl: 0 .  citalopram (CELEXA) 20 MG tablet, Take 20 mg by mouth., Disp: , Rfl:  .  ibuprofen (ADVIL,MOTRIN) 800 MG tablet, Take 1 tablet (800 mg total) by mouth 3 (three) times daily., Disp: 21 tablet, Rfl: 0 .  folic acid (FOLVITE) 1 MG tablet, Take 1 mg by mouth., Disp: , Rfl:  .  hydrOXYzine (ATARAX/VISTARIL) 25 MG tablet, Take 25 mg by mouth., Disp: , Rfl:  .  methotrexate (RHEUMATREX) 2.5 MG tablet, Take 15 mg by mouth., Disp: , Rfl:  .  Vitamin D, Ergocalciferol, (DRISDOL) 50000 units CAPS capsule, Take by mouth., Disp: , Rfl:     Review of Systems  Constitutional: Positive for fever. Negative for unexpected weight change.  HENT: Positive for congestion, ear pain, sinus pressure, sneezing, sore throat and trouble swallowing. Negative for dental problem, nosebleeds, postnasal drip and rhinorrhea.   Eyes: Positive for itching. Negative for redness.  Respiratory: Positive for cough, chest tightness, shortness of breath and wheezing.   Cardiovascular: Positive for palpitations and leg swelling.  Gastrointestinal: Negative for nausea and vomiting.  Genitourinary: Negative for dysuria.  Musculoskeletal: Positive for joint swelling.  Skin: Negative for rash.  Allergic/Immunologic:  Positive for environmental allergies and food allergies. Negative for immunocompromised state.  Neurological: Positive for headaches.  Hematological: Bruises/bleeds easily.  Psychiatric/Behavioral: Positive for dysphoric mood. The patient is nervous/anxious.        Objective:   Physical Exam  Constitutional: She is oriented to person, place, and time. She appears well-developed and well-nourished. No distress.  HENT:  Head: Normocephalic and atraumatic.  Right Ear: External ear normal.  Left Ear: External ear normal.  Mouth/Throat: Oropharynx is clear and moist. No oropharyngeal exudate.  Eyes: Conjunctivae and EOM are normal. Pupils are equal, round, and reactive to light. Right eye exhibits no discharge. Left eye exhibits no discharge. No scleral icterus.  Neck: Normal range of motion. Neck supple. No JVD present. No tracheal deviation present. No thyromegaly present.  Cardiovascular: Normal rate, regular  rhythm, normal heart sounds and intact distal pulses. Exam reveals no gallop and no friction rub.  No murmur heard. Pulmonary/Chest: Effort normal and breath sounds normal. No respiratory distress. She has no wheezes. She has no rales. She exhibits no tenderness.  Abdominal: Soft. Bowel sounds are normal. She exhibits no distension and no mass. There is no tenderness. There is no rebound and no guarding.  Musculoskeletal: Normal range of motion. She exhibits no edema or tenderness.  Lymphadenopathy:    She has no cervical adenopathy.  Neurological: She is alert and oriented to person, place, and time. She has normal reflexes. No cranial nerve deficit. She exhibits normal muscle tone. Coordination normal.  Skin: Skin is warm and dry. No rash noted. She is not diaphoretic. No erythema. No pallor.  Psychiatric: She has a normal mood and affect. Her behavior is normal. Judgment and thought content normal.  Vitals reviewed.   Vitals:   04/20/17 0917  BP: 122/62  Pulse: 97  SpO2: 100%    Weight: 146 lb 6.4 oz (66.4 kg)  Height: 5\' 5"  (1.651 m)    Estimated body mass index is 24.36 kg/m as calculated from the following:   Height as of this encounter: 5\' 5"  (1.651 m).   Weight as of this encounter: 146 lb 6.4 oz (66.4 kg).       Assessment & Plan:     ICD-10-CM   1. Shortness of breath R06.02   2. Chronic cough R05   3. Pleuritic pain R07.81   4. 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 But need to rule out RA related ILD v Obstructive lung disease. Also rule out seasonal allergies.   PLAN  Do blood work d-dimer, IgE , Cbc with diff and blood allergy panel  Do HRCT   Do full PFT  Followup - return to see me or APP after above next few to several weeks   Dr. , M.D., [facility].C.P Pulmonary and Critical Care Medicine Staff Physician, [facility] Center Director - Interstitial Lung Disease  Program  Pulmonary Fibrosis [facility] at [facility] [place], [facility], [place]  Pager: [phone], If no answer or between  15:00h - 7:00h: call [phone] Telephone: [phone]

## 2017-04-20 NOTE — Patient Instructions (Addendum)
ICD-10-CM   1. Shortness of breath R06.02   2. Chronic cough R05   3. Pleuritic pain R07.81    Do blood work d-dimer, IgE , Cbc with diff and blood allergy panel  Do HRCT   Do full PFT  Followup - return to see me or APP after above next few to several weeks

## 2017-04-21 LAB — RESPIRATORY ALLERGY PROFILE REGION II ~~LOC~~
ALLERGEN, COMM SILVER BIRCH, T3: 0.24 kU/L — AB
Allergen, Cedar tree, t12: 0.25 kU/L — ABNORMAL HIGH
Allergen, Cottonwood, t14: 0.23 kU/L — ABNORMAL HIGH
Allergen, Mouse Urine Protein, e78: <0.1 kU/L
Allergen, Mulberry, t76: 0.17 kU/L — ABNORMAL HIGH
Allergen, Oak,t7: 0.23 kU/L — ABNORMAL HIGH
Allergen, P. notatum, m1: <0.1 kU/L
Aspergillus fumigatus, m3: <0.1 kU/L
BERMUDA GRASS: 4.28 kU/L — AB
BOX ELDER: 0.59 kU/L — AB
CLADOSPORIUM HERBARUM (M2) IGE: <0.1 kU/L
CLASS: 0
CLASS: 0
CLASS: 0
CLASS: 0
CLASS: 0
CLASS: 0
CLASS: 0
CLASS: 0
CLASS: 1
CLASS: 2
CLASS: 3
COMMON RAGWEED (SHORT) (W1) IGE: 1.11 kU/L — ABNORMAL HIGH
Class: 0
Class: 0
Class: 0
Class: 0
Class: 0
Class: 0
Class: 0
Class: 0
Class: 0
Class: 1
Class: 1
Class: 2
Class: 2
Cockroach: 0.26 kU/L — ABNORMAL HIGH
D. farinae: <0.1 kU/L
Dog Dander: <0.1 kU/L
ELM IGE: 0.35 kU/L — AB
IGE (IMMUNOGLOBULIN E), SERUM: 24 kU/L (ref <=114)
JOHNSON GRASS: 1.09 kU/L — AB
Pecan/Hickory Tree IgE: 0.5 kU/L — ABNORMAL HIGH
Rough Pigweed  IgE: 0.22 kU/L — ABNORMAL HIGH
SHEEP SORREL IGE: 0.23 kU/L — AB
Timothy Grass: 2.42 kU/L — ABNORMAL HIGH

## 2017-04-21 LAB — INTERPRETATION:

## 2017-04-21 LAB — D-DIMER, QUANTITATIVE (NOT AT ARMC)

## 2017-04-26 ENCOUNTER — Inpatient Hospital Stay: Admission: RE | Admit: 2017-04-26 | Payer: Self-pay | Source: Ambulatory Visit

## 2017-04-28 ENCOUNTER — Ambulatory Visit (INDEPENDENT_AMBULATORY_CARE_PROVIDER_SITE_OTHER)
Admission: RE | Admit: 2017-04-28 | Discharge: 2017-04-28 | Disposition: A | Discharge status: Home / Self Care | Payer: Medicaid Other | Source: Ambulatory Visit | Attending: Internal Medicine | Admitting: Internal Medicine

## 2017-04-28 ENCOUNTER — Telehealth: Payer: Self-pay | Admitting: Internal Medicine

## 2017-04-28 ENCOUNTER — Inpatient Hospital Stay: Admission: RE | Admit: 2017-04-28 | Payer: Self-pay | Source: Ambulatory Visit

## 2017-04-28 DIAGNOSIS — R0602 Shortness of breath: Secondary | ICD-10-CM

## 2017-04-28 MED ORDER — AZITHROMYCIN 250 MG PO TABS: ORAL_TABLET | ORAL | 0 refills | Status: DC

## 2017-04-28 NOTE — Telephone Encounter (Signed)
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 Lactose Intolerance (Gi) Diarrhea  . Other     Grass - giver her a rash  . Oxycontin [Oxycodone Hcl] Hives

## 2017-04-28 NOTE — Telephone Encounter (Signed)
Called pt and advised message from the provider. Pt understood and verbalized understanding. Nothing further is needed.   She refused the prednisone so I called in Zpak.

## 2017-04-28 NOTE — Telephone Encounter (Signed)
Called spoke with patient to discuss results/recommendations as stated by MR Pt voiced her understanding, however she does mention some changing symptoms onset today and would like recommendations for this:  Pt c/o prod cough that is now producing yellow mucus, wheezing, sinus pressure/congestion with clear drainage and PND.  Pt denies any tightness, hemoptysis, f/c/s, chest pain.  She is scheduled for follow up w/ PFT on 1.9.19 w/ MR  MR please advise, thank you.

## 2017-04-28 NOTE — Telephone Encounter (Signed)
Let Janice Pena know that blood allergy mildly positive for varous things. CT is c/w asthma. She should finish her remaining tests and return for followup in Jan 2019 as scheduled for discussion and next steps  .Dr. Kalman Shan, M.D., [facility].C.P Pulmonary and Critical Care Medicine Staff Physician, [facility] Center Director - Interstitial Lung Disease  Program  Pulmonary Fibrosis [facility] at [facility] [place], [place], [address]  Pager: [phone], If no answer or between  15:00h - 7:00h: call [phone] Telephone: [phone]

## 2017-05-26 ENCOUNTER — Ambulatory Visit: Payer: Medicaid Other | Admitting: Internal Medicine

## 2017-05-26 ENCOUNTER — Encounter: Payer: Self-pay | Admitting: Internal Medicine

## 2017-05-26 ENCOUNTER — Ambulatory Visit (INDEPENDENT_AMBULATORY_CARE_PROVIDER_SITE_OTHER): Payer: Medicaid Other | Admitting: Internal Medicine

## 2017-05-26 VITALS — BP 120/70 | HR 52 | Ht 65.5 in | Wt 145.0 lb

## 2017-05-26 DIAGNOSIS — F17200 Nicotine dependence, unspecified, uncomplicated: Secondary | ICD-10-CM

## 2017-05-26 DIAGNOSIS — J4540 Moderate persistent asthma, uncomplicated: Secondary | ICD-10-CM

## 2017-05-26 DIAGNOSIS — J387 Other diseases of larynx: Secondary | ICD-10-CM

## 2017-05-26 DIAGNOSIS — R0602 Shortness of breath: Secondary | ICD-10-CM | POA: Diagnosis not present

## 2017-05-26 LAB — PULMONARY FUNCTION TEST
DL/VA % pred: 126 %
DL/VA: 6.28 ml/min/mmHg/L
DLCO cor % pred: 96 %
DLCO cor: 25.32 ml/min/mmHg
DLCO unc % pred: 98 %
DLCO unc: 25.85 ml/min/mmHg
FEF 25-75 Post: 1.87 L/sec
FEF 25-75 Pre: 1.24 L/sec
FEF2575-%Change-Post: 50 %
FEF2575-%Pred-Post: 58 %
FEF2575-%Pred-Pre: 39 %
FEV1-%Change-Post: 15 %
FEV1-%Pred-Post: 77 %
FEV1-%Pred-Pre: 67 %
FEV1-Post: 2.18 L
FEV1-Pre: 1.88 L
FEV1FVC-%Change-Post: 14 %
FEV1FVC-%Pred-Pre: 80 %
FEV6-%Change-Post: 2 %
FEV6-%Pred-Post: 84 %
FEV6-%Pred-Pre: 82 %
FEV6-Post: 2.78 L
FEV6-Pre: 2.71 L
FEV6FVC-%Change-Post: 1 %
FEV6FVC-%Pred-Post: 100 %
FEV6FVC-%Pred-Pre: 99 %
FVC-%Change-Post: 1 %
FVC-%Pred-Post: 83 %
FVC-%Pred-Pre: 82 %
FVC-Post: 2.79 L
FVC-Pre: 2.75 L
Post FEV1/FVC ratio: 78 %
Post FEV6/FVC ratio: 100 %
Pre FEV1/FVC ratio: 68 %
Pre FEV6/FVC Ratio: 98 %
RV % pred: 128 %
RV: 1.97 L
TLC % pred: 86 %
TLC: 4.56 L

## 2017-05-26 NOTE — Progress Notes (Signed)
Subjective:     Patient ID: Janice Pena, female   DOB: [birth year], 32 y.o.   MRN: [id number]  HPI PCP Jackie Plum, MD  HPI   IOV 04/20/2017  Chief Complaint  Patient presents with  . Advice Only    Referred by Dr. Julio Sicks. Pt states that she has rheumatoid arthritis and was prescribed prednisone also to see if it could help with asthma. States that she wakes up with a bad cough with clear to thick white mucus. Also states that she gets SOB and CP.  Pt does have pain in the lower left of her lung.    32 year old female.  Presents with her friend Janice Pena.  She is here for cough shortness of breath and left-sided infra-axillary pain.  She is a smoker  Patient is a good historian but talks a lot.  She tells me that in 2014 she was diagnosed with rheumatoid arthritis.  Was being followed at [facility].  Initially maintained on nonsteroidal anti-inflammatory drugs.  Seen in 2017 and then did not follow-up.  She went back to the rheumatologist Dr. Victorino Dike in September 2018 because of worsening joint pain.  Review of the chart shows that she had normal hepatitis serology, CBC and chemistry but an extremely low vitamin D level of 13.  She had normal CCP and also liver function test in September 2018.  Methotrexate was it was recommended but she has not started this because of ongoing shortness of breath cough and left sided infra-axillary chest pain.  These respiratory symptoms have been going on for 4 months and getting worse.  There is severe and present day and night.  No clear-cut aggravating or relieving factors.  At one time she said smoking makes her symptoms worse.  At another time she said smoking makes her symptoms better.  There is associated anxiety.  Denies any marijuana, cocaine or heroin or any other substance abuse.  She is concerned that she has asthma.  On her asthma control questionnaire she said she is waking up in the middle of the night great many times  because of shortness of breath and she has severe symptoms when she wakes up.  She finds activities limited.  She is also experiencing a very great deal of shortness of breath and wheezing most of the time and using albuterol for rescue at least couple of times daily.  The average five-point asthma score is 5 showing significant amount of symptoms.  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 The  exam nitric oxide is normal at 5 ppb.  Last prednisone was in September 2018 and this was a short course given for joint symptoms.  Of note she had a wrist x-ray in 2018 that did not show any erosions. She also claims spring and seasonal allergies   + 2011 CT chest and 2018 September chest x-ray that I personally visualized are clear.  The chest x-ray in 2018 shows hyperinflation.  In 2012 - normal D Dimer    OV 05/26/2017 Chief Complaint  Patient presents with  . Follow-up    PFT done today.  Pt states she is feeling better than she was at last visit. Pt was switched to [facility] from Advair which has helped.   Last visit she presented for chronic cough.  Her exam nitric oxide was normal.  I switched her Advair to [facility].  After that she states she 60% better.  Meanwhile she underwent workup for asthma.  Her  IgE is normal.  Her used to feels her normal.  Her exam nitric oxide is normal.  Nevertheless her blood allergy panel is positive at a lower level for many different allergens.  CT scan showed air trapping consistent with asthma.  Today she had CT scan of the chest that shows obstructive lung disease with normal diffusion capacity but a positive bronchodilator response.    Dr Gretta Cool Reflux Symptom Index (> 13-15 suggestive of LPR cough) 04/20/2017 feno - 5 ppb 05/26/2017   Hoarseness of problem with voice 3   Clearing  Of Throat 4   Excess throat mucus or feeling of post nasal drip 5   Difficulty swallowing food, liquid or tablets 2   Cough after eating or lying down 5   Breathing  difficulties or choking episodes 3   Troublesome or annoying cough 5   Sensation of something sticking in throat or lump in throat 4   Heartburn, chest pain, indigestion, or stomach acid coming up 5   TOTAL 36 60% better   Results for KAVYA, HAAG (MRN [id number]) as of 05/26/2017 12:03  Ref. Range 09/05/2014 21:49 09/30/2014 11:29 11/29/2014 06:47 03/06/2015 23:12 04/20/2017 10:13  Eosinophils Absolute Latest Ref Range: 0.0 - 0.7 K/uL 0.3  0.2  0.1   Results for SADONNA, KOTARA (MRN [id number]) as of 05/26/2017 12:03  Ref. Range 04/20/2017 10:13  Sheep Sorrel IgE Latest Units: kU/L 0.23 (H)  Pecan/Hickory Tree IgE Latest Units: kU/L 0.50 (H)  IgE (Immunoglobulin E), Serum Latest Ref Range: <OR=114 kU/L 24  Allergen, D pternoyssinus,d7 Latest Units: kU/L <0.10  Cat Dander Latest Units: kU/L <0.10  Dog Dander Latest Units: kU/L <0.10  [place] Grass Latest Units: kU/L 4.28 (H)  Johnson Grass Latest Units: kU/L 1.09 (H)  Timothy Grass Latest Units: kU/L 2.42 (H)  Cockroach Latest Units: kU/L 0.26 (H)  Elm IgE Latest Units: kU/L 0.35 (H)  COMMON RAGWEED (SHORT) (W1) IGE Latest Units: kU/L 1.11 (H)  D. farinae Latest Units: kU/L <0.10  Box Elder IgE Latest Units: kU/L 0.59 (H)  Rough Pigweed  IgE Latest Units: kU/L 0.22 (H)    IMPRESSION: HRCT 04/28/17 1. No evidence of interstitial lung disease. 2. Diffuse bronchial wall thickening. Air trapping in the lingula. Findings are compatible with the provided history of asthma .   Electronically Signed   By: Delbert Phenix M.D.   On: 04/28/2017 13:48   Pulmonary function test shows moderate obstruction with reversibility in her normal DLCO.  FEV1 prebronchodilator is 1.88 L / 67%, postbronchodilator is 2.18 L / 77%.  Is a 15% bronchodilator response and a ratio of 68 prebronchodilator.   has a past medical history of Anxiety, Anxiety, Asthma, Asthma, Bilateral ovarian cysts, GERD (gastroesophageal reflux disease), HSV infection, Left  wrist pain, Rheumatoid arteritis, and Vitamin D deficiency.   reports that she has been smoking cigarettes.  She started smoking about 16 years ago. She has a 13.00 pack-year smoking history. she has never used smokeless tobacco.  Past Surgical History:  Procedure Laterality Date  . ABDOMINAL HYSTERECTOMY      Allergies  Allergen Reactions  . Lactose Intolerance (Gi) Diarrhea  . Other     Grass - giver her a rash  . Oxycontin [Oxycodone Hcl] Hives     There is no immunization history on file for this patient.  Family History  Problem Relation Age of Onset  . Hypertension Mother   . Hyperlipidemia Mother   . Asthma Mother   . Anxiety disorder Mother   .  Depression Mother   . Hypertension Father   . Hyperlipidemia Father   . Asthma Father      Current Outpatient Medications:  .  albuterol (PROVENTIL HFA;VENTOLIN HFA) 108 (90 Base) MCG/ACT inhaler, Inhale 1-2 puffs into the lungs every 6 (six) hours as needed for wheezing., Disp: 1 Inhaler, Rfl: 0 .  albuterol (PROVENTIL) (2.5 MG/3ML) 0.083% nebulizer solution, Take 3 mLs (2.5 mg total) by nebulization every 6 (six) hours as needed for wheezing or shortness of breath., Disp: 75 mL, Rfl: 0 .  citalopram (CELEXA) 20 MG tablet, Take 20 mg by mouth., Disp: , Rfl:  .  DULERA 200-5 MCG/ACT AERO, Inhale 2 puffs into the lungs 2 (two) times daily., Disp: , Rfl: 2 .  folic acid (FOLVITE) 1 MG tablet, Take 1 mg by mouth., Disp: , Rfl:  .  hydrOXYzine (ATARAX/VISTARIL) 25 MG tablet, Take 25 mg by mouth., Disp: , Rfl:  .  ibuprofen (ADVIL,MOTRIN) 800 MG tablet, Take 1 tablet (800 mg total) by mouth 3 (three) times daily., Disp: 21 tablet, Rfl: 0 .  methotrexate (RHEUMATREX) 2.5 MG tablet, Take 15 mg by mouth., Disp: , Rfl:  .  Vitamin D, Ergocalciferol, (DRISDOL) 50000 units CAPS capsule, Take by mouth., Disp: , Rfl:   Review of Systems     Objective:   Physical Exam  Constitutional: She is oriented to person, place, and time. She  appears well-developed and well-nourished. No distress.  HENT:  Head: Normocephalic and atraumatic.  Right Ear: External ear normal.  Left Ear: External ear normal.  Mouth/Throat: Oropharynx is clear and moist. No oropharyngeal exudate.  Smells of tobacco  Eyes: Conjunctivae and EOM are normal. Pupils are equal, round, and reactive to light. Right eye exhibits no discharge. Left eye exhibits no discharge. No scleral icterus.  Neck: Normal range of motion. Neck supple. No JVD present. No tracheal deviation present. No thyromegaly present.  Cardiovascular: Normal rate, regular rhythm, normal heart sounds and intact distal pulses. Exam reveals no gallop and no friction rub.  No murmur heard. Pulmonary/Chest: Effort normal and breath sounds normal. No respiratory distress. She has no wheezes. She has no rales. She exhibits no tenderness.  Abdominal: Soft. Bowel sounds are normal. She exhibits no distension and no mass. There is no tenderness. There is no rebound and no guarding.  Musculoskeletal: Normal range of motion. She exhibits no edema or tenderness.  Lymphadenopathy:    She has no cervical adenopathy.  Neurological: She is alert and oriented to person, place, and time. She has normal reflexes. No cranial nerve deficit. She exhibits normal muscle tone. Coordination normal.  Skin: Skin is warm and dry. No rash noted. She is not diaphoretic. No erythema. No pallor.  Psychiatric: She has a normal mood and affect. Her behavior is normal. Judgment and thought content normal.  Vitals reviewed.   Vitals:   05/26/17 1121  BP: 120/70  Pulse: (!) 52  SpO2: 96%  Weight: 145 lb (65.8 kg)  Height: 5' 5.5" (1.664 m)       Assessment:       ICD-10-CM   1. Moderate persistent asthma, unspecified whether complicated J45.40   2. Irritable larynx J38.7   3. Smoking F17.200    She has asthma phenotype but in the presence of normal IgE and exam nitric oxide.  Also her use no fields are normal.   Blood allergy panel slightly positive.  This could be asthma with normal exam nitric oxide and otherwise non-eosinophilic asthma.  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  She continues to smoke and is a danger for her in the long-term.  Certainly I think there is an overlay with irritable larynx syndrome and therefore the plan is    Plan:     Moderate persistent asthma, unspecified whether complicated   - glad 60% better with dulera - will not add singulair to get further control due to lack of improvement in past  - continue dulera scheduled with albuterol as needed  - respect flu shot refusal  Irritable larynx - refer [facility] - these exercise could help with cough  Smoking - need to quit; glad you will talk to PCP Osei-Bonsu, Greggory Stallion, MD   Followup 3 months or sooner if needed  - RSI cough scorfe at followup  Dr. Kalman Shan, M.D., [facility].C.P Pulmonary and Critical Care Medicine Staff Physician, [facility] Center Director - Interstitial Lung Disease  Program  Pulmonary Fibrosis [facility] at [facility] [place], [place], [address]  Pager: [phone], If no answer or between  15:00h - 7:00h: call [phone] Telephone: [phone]

## 2017-05-26 NOTE — Patient Instructions (Signed)
Moderate persistent asthma, unspecified whether complicated   - glad 60% better with dulera - will not add singulair to get further control due to lack of improvement in past  - continue dulera scheduled with albuterol as needed  - respect flu shot refusal  Irritable larynx - refer [facility] - these exercise could help with cough  Smoking - need to quit; glad you will talk to PCP Janice Plum, MD   Followup 3 months or sooner if needed  - RSI cough scorfe at followup

## 2017-05-26 NOTE — Progress Notes (Signed)
PFT done today. 

## 2017-06-22 ENCOUNTER — Telehealth: Payer: Self-pay | Admitting: Internal Medicine

## 2017-06-22 NOTE — Telephone Encounter (Signed)
Called and spoke with patient, advised her of MR response. Patient verbalized understanding. Nothing further needed. 

## 2017-06-22 NOTE — Telephone Encounter (Signed)
The pft was in a patter c/w diagnosis of asthma. I told her that. In my note I documented, "Pulmonary function test shows moderate obstruction with reversibility in her normal DLCO.  FEV1 prebronchodilator is 1.88 L / 67%, postbronchodilator is 2.18 L / 77%.  Is a 15% bronchodilator response and a ratio of 68 prebronchodilator."   Dr. Kalman Shan, M.D., [facility].C.P Pulmonary and Critical Care Medicine Staff Physician, [facility] Center Director - Interstitial Lung Disease  Program  Pulmonary Fibrosis [facility] at [facility] [place], [place], [address]  Pager: [phone], If no answer or between  15:00h - 7:00h: call [phone] Telephone: [phone]

## 2017-06-22 NOTE — Telephone Encounter (Signed)
Called patient, unable to reach. LMTCB    MR pleas advise on what we need to tell the patient regarding results.

## 2017-06-22 NOTE — Telephone Encounter (Signed)
Pt is calling back [phone]

## 2017-06-24 ENCOUNTER — Ambulatory Visit: Payer: Medicaid Other | Attending: Internal Medicine | Admitting: Speech Pathology

## 2017-08-30 ENCOUNTER — Ambulatory Visit: Payer: Self-pay | Admitting: Acute Care

## 2017-08-30 ENCOUNTER — Ambulatory Visit: Payer: Self-pay | Admitting: Internal Medicine

## 2017-08-30 NOTE — Progress Notes (Deleted)
History of Present Illness Janice Pena is a 32 y.o. female current every day smoker with non-eosinophilic asthma, ? Irritable larynx cough, followed by Dr. Marchelle Gearing.    08/30/2017 Pt. Presents for follow up. She was last seen by Dr. Marchelle Gearing 06/05/2017. Plan at that visit was as follows.  Pt. has asthma phenotype but in the presence of normal IgE and exam nitric oxide.  Blood allergy panel slightly positive.  This could be asthma with normal exam nitric oxide and otherwise non-eosinophilic asthma.  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  She continues to smoke despite long term danger to her health.  Dr. Marchelle Gearing feels  there is an overlay with irritable larynx syndrome and therefore the plan is    Plan:     Moderate persistent asthma, unspecified whether complicated   - glad 60% better with dulera - will not add singulair to get further control due to lack of improvement in past  - continue dulera scheduled with albuterol as needed  - respect flu shot refusal  Irritable larynx - refer [facility] - these exercise could help with cough  Smoking - need to quit; glad you will talk to PCP Janice Plum, MD   Followup 3 months or sooner if needed             - RSI cough score at followup  Pt. Presents for follow up today. She states she has been compliant with use of her Spiriva.            Test Results:  CBC Latest Ref Rng & Units 04/20/2017 03/06/2015 11/29/2014  WBC 4.0 - 10.5 K/uL 7.6 10.3 6.9  Hemoglobin 12.0 - 15.0 g/dL 15.3(H) 14.7 13.8  Hematocrit 36.0 - 46.0 % 45.4 41.3 39.9  Platelets 150.0 - 400.0 K/uL 316.0 297 265    BMP Latest Ref Rng & Units 03/06/2015 11/29/2014 09/30/2014  Glucose 65 - 99 mg/dL 970(Y) 89 89  BUN 6 - 20 mg/dL 11 <6(V) 6  Creatinine 0.44 - 1.00 mg/dL 7.85 8.85 0.27  Sodium 135 - 145 mmol/L 138 139 138  Potassium 3.5 - 5.1 mmol/L 3.9 3.5 4.0    Chloride 101 - 111 mmol/L 105 105 104  CO2 22 - 32 mmol/L 27 27 29   Calcium 8.9 - 10.3 mg/dL 9.9 9.3 9.0    BNP No results found for: BNP  ProBNP No results found for: PROBNP  PFT    Component Value Date/Time   FEV1PRE 1.88 05/26/2017 1005   FEV1POST 2.18 05/26/2017 1005   FVCPRE 2.75 05/26/2017 1005   FVCPOST 2.79 05/26/2017 1005   TLC 4.56 05/26/2017 1005   DLCOUNC 25.85 05/26/2017 1005   PREFEV1FVCRT 68 05/26/2017 1005   PSTFEV1FVCRT 78 05/26/2017 1005    No results found.   Past medical hx Past Medical History:  Diagnosis Date  . Anxiety   . Anxiety   . Asthma   . Asthma   . Bilateral ovarian cysts   . GERD (gastroesophageal reflux disease)   . HSV infection   . Left wrist pain   . Rheumatoid arteritis   . Vitamin D deficiency      Social History   Tobacco Use  . Smoking status: Current Every Day Smoker    Packs/day: 1.00    Years: 13.00    Pack years: 13.00    Types: Cigarettes    Start date: 2003    Last attempt to quit: 06/03/2014  Years since quitting: 3.2  . Smokeless tobacco: Never Used  . Tobacco comment: 6-7 cigs per day as of 05/26/17  ep  Substance Use Topics  . Alcohol use: No    Alcohol/week: 0.0 oz  . Drug use: No    Janice.Pena reports that she has been smoking cigarettes.  She started smoking about 16 years ago. She has a 13.00 pack-year smoking history. She has never used smokeless tobacco. She reports that she does not drink alcohol or use drugs.  Tobacco Cessation: Ready to quit: Not Answered Counseling given: Not Answered Comment: 6-7 cigs per day as of 05/26/17  ep   Past surgical hx, Family hx, Social hx all reviewed.  Current Outpatient Medications on File Prior to Visit  Medication Sig  . albuterol (PROVENTIL HFA;VENTOLIN HFA) 108 (90 Base) MCG/ACT inhaler Inhale 1-2 puffs into the lungs every 6 (six) hours as needed for wheezing.  Marland Kitchen albuterol (PROVENTIL) (2.5 MG/3ML) 0.083% nebulizer solution Take 3 mLs (2.5 mg  total) by nebulization every 6 (six) hours as needed for wheezing or shortness of breath.  . citalopram (CELEXA) 20 MG tablet Take 20 mg by mouth.  . DULERA 200-5 MCG/ACT AERO Inhale 2 puffs into the lungs 2 (two) times daily.  . folic acid (FOLVITE) 1 MG tablet Take 1 mg by mouth.  . hydrOXYzine (ATARAX/VISTARIL) 25 MG tablet Take 25 mg by mouth.  Marland Kitchen ibuprofen (ADVIL,MOTRIN) 800 MG tablet Take 1 tablet (800 mg total) by mouth 3 (three) times daily.  . methotrexate (RHEUMATREX) 2.5 MG tablet Take 15 mg by mouth.  . Vitamin D, Ergocalciferol, (DRISDOL) 50000 units CAPS capsule Take by mouth.   No current facility-administered medications on file prior to visit.      Allergies  Allergen Reactions  . Lactose Intolerance (Gi) Diarrhea  . Other     Grass - giver her a rash  . Oxycontin [Oxycodone Hcl] Hives    Review Of Systems:  Constitutional:   No  weight loss, night sweats,  Fevers, chills, fatigue, or  lassitude.  HEENT:   No headaches,  Difficulty swallowing,  Tooth/dental problems, or  Sore throat,                No sneezing, itching, ear ache, nasal congestion, post nasal drip,   CV:  No chest pain,  Orthopnea, PND, swelling in lower extremities, anasarca, dizziness, palpitations, syncope.   GI  No heartburn, indigestion, abdominal pain, nausea, vomiting, diarrhea, change in bowel habits, loss of appetite, bloody stools.   Resp: No shortness of breath with exertion or at rest.  No excess mucus, no productive cough,  No non-productive cough,  No coughing up of blood.  No change in color of mucus.  No wheezing.  No chest wall deformity  Skin: no rash or lesions.  GU: no dysuria, change in color of urine, no urgency or frequency.  No flank pain, no hematuria   Janice:  No joint pain or swelling.  No decreased range of motion.  No back pain.  Psych:  No change in mood or affect. No depression or anxiety.  No memory loss.   Vital Signs There were no vitals taken for this  visit.   Physical Exam:  General- No distress,  A&Ox3 ENT: No sinus tenderness, TM clear, pale nasal mucosa, no oral exudate,no post nasal drip, no LAN Cardiac: S1, S2, regular rate and rhythm, no murmur Chest: No wheeze/ rales/ dullness; no accessory muscle use, no nasal flaring, no sternal retractions  Abd.: Soft Non-tender Ext: No clubbing cyanosis, edema Neuro:  normal strength Skin: No rashes, warm and dry Psych: normal mood and behavior   Assessment/Plan  No problem-specific Assessment & Plan notes found for this encounter.    Bevelyn Ngo, NP 08/30/2017  8:57 AM

## 2017-09-23 ENCOUNTER — Ambulatory Visit: Payer: Self-pay | Admitting: Internal Medicine

## 2018-07-21 ENCOUNTER — Other Ambulatory Visit: Payer: Self-pay | Admitting: Physician Assistant

## 2018-07-21 DIAGNOSIS — R635 Abnormal weight gain: Secondary | ICD-10-CM

## 2018-07-21 DIAGNOSIS — E041 Nontoxic single thyroid nodule: Secondary | ICD-10-CM

## 2018-08-05 ENCOUNTER — Ambulatory Visit
Admission: RE | Admit: 2018-08-05 | Discharge: 2018-08-05 | Disposition: A | Discharge status: Home / Self Care | Payer: Medicaid Other | Source: Ambulatory Visit | Attending: Physician Assistant | Admitting: Physician Assistant

## 2018-08-05 DIAGNOSIS — E041 Nontoxic single thyroid nodule: Secondary | ICD-10-CM

## 2018-08-05 DIAGNOSIS — R635 Abnormal weight gain: Secondary | ICD-10-CM

## 2018-08-12 ENCOUNTER — Other Ambulatory Visit: Payer: Self-pay | Admitting: Otolaryngology

## 2018-08-12 DIAGNOSIS — R1314 Dysphagia, pharyngoesophageal phase: Secondary | ICD-10-CM

## 2019-01-12 ENCOUNTER — Emergency Department (HOSPITAL_COMMUNITY): Payer: Medicaid Other

## 2019-01-12 ENCOUNTER — Emergency Department (HOSPITAL_COMMUNITY)
Admission: EM | Admit: 2019-01-12 | Discharge: 2019-01-12 | Disposition: A | Discharge status: Home / Self Care | Payer: Medicaid Other | Attending: Emergency Medicine | Admitting: Emergency Medicine

## 2019-01-12 ENCOUNTER — Other Ambulatory Visit: Payer: Self-pay

## 2019-01-12 ENCOUNTER — Encounter (HOSPITAL_COMMUNITY): Payer: Self-pay

## 2019-01-12 DIAGNOSIS — R05 Cough: Secondary | ICD-10-CM | POA: Diagnosis not present

## 2019-01-12 DIAGNOSIS — R0600 Dyspnea, unspecified: Secondary | ICD-10-CM

## 2019-01-12 DIAGNOSIS — J45909 Unspecified asthma, uncomplicated: Secondary | ICD-10-CM | POA: Diagnosis not present

## 2019-01-12 DIAGNOSIS — F17210 Nicotine dependence, cigarettes, uncomplicated: Secondary | ICD-10-CM | POA: Diagnosis not present

## 2019-01-12 DIAGNOSIS — Z79899 Other long term (current) drug therapy: Secondary | ICD-10-CM | POA: Insufficient documentation

## 2019-01-12 DIAGNOSIS — R0789 Other chest pain: Secondary | ICD-10-CM

## 2019-01-12 LAB — BASIC METABOLIC PANEL
Anion gap: 10 (ref 5–15)
BUN: 7 mg/dL (ref 6–20)
CO2: 25 mmol/L (ref 22–32)
Calcium: 9.7 mg/dL (ref 8.9–10.3)
Chloride: 104 mmol/L (ref 98–111)
Creatinine, Ser: 0.86 mg/dL (ref 0.44–1.00)
GFR calc Af Amer: >60 mL/min (ref >60)
GFR calc non Af Amer: >60 mL/min (ref >60)
Glucose, Bld: 104 mg/dL — ABNORMAL HIGH (ref 70–99)
Potassium: 3.5 mmol/L (ref 3.5–5.1)
Sodium: 139 mmol/L (ref 135–145)

## 2019-01-12 LAB — CBC WITH DIFFERENTIAL/PLATELET
Abs Immature Granulocytes: 0.01 10*3/uL (ref 0.00–0.07)
Basophils Absolute: 0.1 10*3/uL (ref 0.0–0.1)
Basophils Relative: 1 %
Eosinophils Absolute: 0.2 10*3/uL (ref 0.0–0.5)
Eosinophils Relative: 2 %
HCT: 44.8 % (ref 36.0–46.0)
Hemoglobin: 15.5 g/dL — ABNORMAL HIGH (ref 12.0–15.0)
Immature Granulocytes: 0 %
Lymphocytes Relative: 38 %
Lymphs Abs: 3.6 10*3/uL (ref 0.7–4.0)
MCH: 30.3 pg (ref 26.0–34.0)
MCHC: 34.6 g/dL (ref 30.0–36.0)
MCV: 87.5 fL (ref 80.0–100.0)
Monocytes Absolute: 0.5 10*3/uL (ref 0.1–1.0)
Monocytes Relative: 5 %
Neutro Abs: 5.2 10*3/uL (ref 1.7–7.7)
Neutrophils Relative %: 54 %
Platelets: 334 10*3/uL (ref 150–400)
RBC: 5.12 MIL/uL — ABNORMAL HIGH (ref 3.87–5.11)
RDW: 13.1 % (ref 11.5–15.5)
WBC: 9.5 10*3/uL (ref 4.0–10.5)
nRBC: 0 % (ref 0.0–0.2)

## 2019-01-12 LAB — TROPONIN I (HIGH SENSITIVITY): Troponin I (High Sensitivity): <2 ng/L (ref <18)

## 2019-01-12 LAB — D-DIMER, QUANTITATIVE: D-Dimer, Quant: <0.27 ug/mL-FEU (ref 0.00–0.50)

## 2019-01-12 NOTE — ED Triage Notes (Signed)
Per EMS- Patient c/o SOB x 1 month and feels like she has phlegm in her chest. Lungs clear. Patient states she has had SOB today after walking for 40 minutes. Patient states she was seen 10 days ago for the same and was told to follow up with a pulmonologist. Patient has a history of asthma.

## 2019-01-16 NOTE — ED Provider Notes (Signed)
[facility] DEPT Provider Note   CSN: [id number] Arrival date & time: 01/12/19  1309     History   Chief Complaint Chief Complaint  Patient presents with  . Shortness of Breath  . Cough    HPI Janice Pena is a 33 y.o. female.  HPI   33 year old female with dyspnea.  Onset about a month ago.  Persistent.  She feels like she has phlegm in her chest which cannot get up.  Today she was walking and feeling short of breath at approximately 40 minutes.  She feels like she is not felt that way for the activity she was doing it.  She has been seen by her PCP for the same complaints.  Child optimize her medications without much improvement.  She was advised to follow-up with a pulmonologist.  She has past history of asthma.  No fevers or chills.  No unusual leg pain or swelling.  Past Medical History:  Diagnosis Date  . Anxiety   . Anxiety   . Asthma   . Asthma   . Bilateral ovarian cysts   . GERD (gastroesophageal reflux disease)   . HSV infection   . Left wrist pain   . Rheumatoid arteritis ([place])   . Vitamin D deficiency     Patient Active Problem List   Diagnosis Date Noted  . S/P hysterectomy for AUB 03/28/2014  . Ovarian cyst 03/28/2014    Past Surgical History:  Procedure Laterality Date  . ABDOMINAL HYSTERECTOMY       OB History    Gravida  2   Para  2   Term      Preterm      AB      Living  2     SAB      TAB      Ectopic      Multiple      Live Births               Home Medications    Prior to Admission medications   Medication Sig Start Date End Date Taking? Authorizing Provider  albuterol (PROVENTIL HFA;VENTOLIN HFA) 108 (90 Base) MCG/ACT inhaler Inhale 1-2 puffs into the lungs every 6 (six) hours as needed for wheezing. 07/20/16   Tanna Furry, MD  albuterol (PROVENTIL) (2.5 MG/3ML) 0.083% nebulizer solution Take 3 mLs (2.5 mg total) by nebulization every 6 (six) hours as needed for wheezing or  shortness of breath. 07/20/16   Tanna Furry, MD  citalopram (CELEXA) 20 MG tablet Take 20 mg by mouth.    [provider]  DULERA 200-5 MCG/ACT AERO Inhale 2 puffs into the lungs 2 (two) times daily. 05/09/17   [provider]  hydrOXYzine (ATARAX/VISTARIL) 25 MG tablet Take 25 mg by mouth.    [provider]  ibuprofen (ADVIL,MOTRIN) 800 MG tablet Take 1 tablet (800 mg total) by mouth 3 (three) times daily. 10/20/14   Dahlia Bailiff, PA-C  methotrexate (RHEUMATREX) 2.5 MG tablet Take 15 mg by mouth. 01/28/17   [provider]  Vitamin D, Ergocalciferol, (DRISDOL) 50000 units CAPS capsule Take by mouth. 02/01/17   [provider]    Family History Family History  Problem Relation Age of Onset  . Hypertension Mother   . Hyperlipidemia Mother   . Asthma Mother   . Anxiety disorder Mother   . Depression Mother   . Hypertension Father   . Hyperlipidemia Father   . Asthma Father  Social History Social History   Tobacco Use  . Smoking status: Current Every Day Smoker    Packs/day: 0.50    Years: 13.00    Pack years: 6.50    Types: Cigarettes    Start date: 2003    Last attempt to quit: 06/03/2014    Years since quitting: 4.6  . Smokeless tobacco: Never Used  . Tobacco comment: 6-7 cigs per day as of 05/26/17  ep  Substance Use Topics  . Alcohol use: No    Alcohol/week: 0.0 standard drinks  . Drug use: No     Allergies   Eggs or egg-derived products, Lactose intolerance (gi), Other, and Oxycontin [oxycodone hcl]   Review of Systems Review of Systems  All systems reviewed and negative, other than as noted in HPI.  Physical Exam Updated Vital Signs BP 133/81   Pulse (!) 56   Temp 98.2 F (36.8 C) (Oral)   Resp 16   Ht 5\' 5"  (1.651 m)   Wt 71.7 kg   SpO2 96%   BMI 26.29 kg/m   Physical Exam Vitals signs and nursing note reviewed.  Constitutional:      General: She is not in acute distress.    Appearance: She is  well-developed.  HENT:     Head: Normocephalic and atraumatic.  Eyes:     General:        Right eye: No discharge.        Left eye: No discharge.     Conjunctiva/sclera: Conjunctivae normal.  Neck:     Musculoskeletal: Neck supple.  Cardiovascular:     Rate and Rhythm: Normal rate and regular rhythm.     Heart sounds: Normal heart sounds. No murmur. No friction rub. No gallop.   Pulmonary:     Effort: Pulmonary effort is normal. No respiratory distress.     Breath sounds: Normal breath sounds.  Abdominal:     General: There is no distension.     Palpations: Abdomen is soft.     Tenderness: There is no abdominal tenderness.  Musculoskeletal:        General: No tenderness.     Comments: Lower extremities symmetric as compared to each other. No calf tenderness. Negative Homan's. No palpable cords.   Skin:    General: Skin is warm and dry.  Neurological:     Mental Status: She is alert.  Psychiatric:        Behavior: Behavior normal.        Thought Content: Thought content normal.      ED Treatments / Results  Labs (all labs ordered are listed, but only abnormal results are displayed) Labs Reviewed  CBC WITH DIFFERENTIAL/PLATELET - Abnormal; Notable for the following components:      Result Value   RBC 5.12 (*)    Hemoglobin 15.5 (*)    All other components within normal limits  BASIC METABOLIC PANEL - Abnormal; Notable for the following components:   Glucose, Bld 104 (*)    All other components within normal limits  D-DIMER, QUANTITATIVE (NOT AT [facility])  TROPONIN I (HIGH SENSITIVITY)    EKG EKG Interpretation  Date/Time:  Thursday January 12 2019 13:25:36 EDT Ventricular Rate:  64 PR Interval:    QRS Duration: 75 QT Interval:  383 QTC Calculation: 396 R Axis:   71 Text Interpretation:  Sinus rhythm No significant change since last tracing Confirmed by Raeford Razor [phone]) on 01/12/2019 6:55:07 PM   Radiology No results found.   Dg  Chest 2 View  Result  Date: 01/12/2019 CLINICAL DATA:  Per EMS- Patient c/o SOB x 1 month and feels like she has phlegm in her chest. Lungs clear. Patient states she has had SOB today after walking for 40 minutes. Patient states she was seen 10 days ago for the same. EXAM: CHEST - 2 VIEW COMPARISON:  Chest radiograph 02/10/2017, chest CT 04/28/2017 FINDINGS: The heart size and mediastinal contours are within normal limits. The lungs are clear without focal infiltrate. Mild hyperinflation. No pneumothorax or pleural effusion. The visualized skeletal structures are unremarkable. IMPRESSION: No acute cardiopulmonary process. Electronically Signed   By: Emmaline Kluver M.D.   On: 01/12/2019 14:16    Procedures Procedures (including critical care time)  Medications Ordered in ED Medications - No data to display   Initial Impression / Assessment and Plan / ED Course  I have reviewed the triage vital signs and the nursing notes.  Pertinent labs & imaging results that were available during my care of the patient were reviewed by me and considered in my medical decision making (see chart for details).       33 year old female with dyspnea.  Objectively, I do not have a clear explanation for her symptoms.  To me she currently sounds clear on exam.  Chest x-ray without acute normality.  She does not appear to be volume overloaded.  She not sniffily anemic.  D-dimer is negative which pretty much rules out PE.  She has been followed by this by her PCP.  Also has an upcoming appointment with pulmonology.  I doubt emergent process.  Final Clinical Impressions(s) / ED Diagnoses   Final diagnoses:  Dyspnea, unspecified type  Left chest pressure    ED Discharge Orders    None       Raeford Razor, MD 01/16/19 1155

## 2019-04-23 ENCOUNTER — Other Ambulatory Visit: Payer: Self-pay

## 2019-04-23 ENCOUNTER — Emergency Department (HOSPITAL_COMMUNITY): Payer: Medicaid Other

## 2019-04-23 ENCOUNTER — Encounter (HOSPITAL_COMMUNITY): Payer: Self-pay

## 2019-04-23 ENCOUNTER — Emergency Department (HOSPITAL_COMMUNITY)
Admission: EM | Admit: 2019-04-23 | Discharge: 2019-04-23 | Disposition: A | Discharge status: Home / Self Care | Payer: Medicaid Other | Attending: Emergency Medicine | Admitting: Emergency Medicine

## 2019-04-23 DIAGNOSIS — R05 Cough: Secondary | ICD-10-CM | POA: Insufficient documentation

## 2019-04-23 DIAGNOSIS — F17210 Nicotine dependence, cigarettes, uncomplicated: Secondary | ICD-10-CM | POA: Diagnosis not present

## 2019-04-23 DIAGNOSIS — J45909 Unspecified asthma, uncomplicated: Secondary | ICD-10-CM | POA: Insufficient documentation

## 2019-04-23 DIAGNOSIS — R21 Rash and other nonspecific skin eruption: Secondary | ICD-10-CM | POA: Diagnosis present

## 2019-04-23 DIAGNOSIS — M069 Rheumatoid arthritis, unspecified: Secondary | ICD-10-CM | POA: Insufficient documentation

## 2019-04-23 DIAGNOSIS — M2550 Pain in unspecified joint: Secondary | ICD-10-CM | POA: Diagnosis not present

## 2019-04-23 DIAGNOSIS — R059 Cough, unspecified: Secondary | ICD-10-CM

## 2019-04-23 LAB — CBC WITH DIFFERENTIAL/PLATELET
Abs Immature Granulocytes: 0.02 10*3/uL (ref 0.00–0.07)
Basophils Absolute: 0 10*3/uL (ref 0.0–0.1)
Basophils Relative: 0 %
Eosinophils Absolute: 0.2 10*3/uL (ref 0.0–0.5)
Eosinophils Relative: 2 %
HCT: 42.6 % (ref 36.0–46.0)
Hemoglobin: 14.5 g/dL (ref 12.0–15.0)
Immature Granulocytes: 0 %
Lymphocytes Relative: 33 %
Lymphs Abs: 2.5 10*3/uL (ref 0.7–4.0)
MCH: 29.1 pg (ref 26.0–34.0)
MCHC: 34 g/dL (ref 30.0–36.0)
MCV: 85.5 fL (ref 80.0–100.0)
Monocytes Absolute: 0.4 10*3/uL (ref 0.1–1.0)
Monocytes Relative: 5 %
Neutro Abs: 4.5 10*3/uL (ref 1.7–7.7)
Neutrophils Relative %: 60 %
Platelets: 289 10*3/uL (ref 150–400)
RBC: 4.98 MIL/uL (ref 3.87–5.11)
RDW: 13.4 % (ref 11.5–15.5)
WBC: 7.6 10*3/uL (ref 4.0–10.5)
nRBC: 0 % (ref 0.0–0.2)

## 2019-04-23 LAB — COMPREHENSIVE METABOLIC PANEL
ALT: 18 U/L (ref 0–44)
AST: 19 U/L (ref 15–41)
Albumin: 3.6 g/dL (ref 3.5–5.0)
Alkaline Phosphatase: 84 U/L (ref 38–126)
Anion gap: 8 (ref 5–15)
BUN: 8 mg/dL (ref 6–20)
CO2: 24 mmol/L (ref 22–32)
Calcium: 9.3 mg/dL (ref 8.9–10.3)
Chloride: 106 mmol/L (ref 98–111)
Creatinine, Ser: 0.88 mg/dL (ref 0.44–1.00)
GFR calc Af Amer: >60 mL/min (ref >60)
GFR calc non Af Amer: >60 mL/min (ref >60)
Glucose, Bld: 88 mg/dL (ref 70–99)
Potassium: 3.8 mmol/L (ref 3.5–5.1)
Sodium: 138 mmol/L (ref 135–145)
Total Bilirubin: 0.5 mg/dL (ref 0.3–1.2)
Total Protein: 6.9 g/dL (ref 6.5–8.1)

## 2019-04-23 NOTE — ED Triage Notes (Signed)
Patient complains of ongoing rash to chest, neck and back for months since taking humira. Has stopped the meds and was treated wih steroid and antibiotic and no relief. Has appt with rheumatologist tomorrow but cant wait

## 2019-04-23 NOTE — ED Provider Notes (Signed)
[facility] Emergency Department Provider Note MRN:  [id number]  Arrival date & time: 04/23/19     Chief Complaint   Rash History of Present Illness   Janice Pena is a 33 y.o. year-old female with a history of rheumatoid arthritis presenting to the ED with chief complaint of rash.  Patient explains that she began having a full body itchy rash back in September.  She was told by her doctor that the rash was likely a reaction to her Humira, which she takes for rheumatoid arthritis.  The rash has persisted despite stopping the Humira back in September.  Seems to be worsening.  Has not improved despite trials of doxycycline, prednisone, various skin lotions.  She has a rheumatology virtual appointment tomorrow but wanted to be seen today as well.  Denies fever.  Has been having on and off cough, funny feelings in the chest for the past 2 months.  Has been having sensation of the thyroid enlarging for the past few months as well.  Review of Systems  A complete 10 system review of systems was obtained and all systems are negative except as noted in the HPI and PMH.   Patient's Health History    Past Medical History:  Diagnosis Date  . Anxiety   . Anxiety   . Asthma   . Asthma   . Bilateral ovarian cysts   . GERD (gastroesophageal reflux disease)   . HSV infection   . Left wrist pain   . Rheumatoid arteritis ([place])   . Vitamin D deficiency     Past Surgical History:  Procedure Laterality Date  . ABDOMINAL HYSTERECTOMY      Family History  Problem Relation Age of Onset  . Hypertension Mother   . Hyperlipidemia Mother   . Asthma Mother   . Anxiety disorder Mother   . Depression Mother   . Hypertension Father   . Hyperlipidemia Father   . Asthma Father     Social History   Socioeconomic History  . Marital status: Married    Spouse name: Not on file  . Number of children: Not on file  . Years of education: Not on file  . Highest education  level: Not on file  Occupational History  . Not on file  Social Needs  . Financial resource strain: Not on file  . Food insecurity    Worry: Not on file    Inability: Not on file  . Transportation needs    Medical: Not on file    Non-medical: Not on file  Tobacco Use  . Smoking status: Current Every Day Smoker    Packs/day: 0.50    Years: 13.00    Pack years: 6.50    Types: Cigarettes    Start date: 2003    Last attempt to quit: 06/03/2014    Years since quitting: 4.8  . Smokeless tobacco: Never Used  . Tobacco comment: 6-7 cigs per day as of 05/26/17  ep  Substance and Sexual Activity  . Alcohol use: No    Alcohol/week: 0.0 standard drinks  . Drug use: No  . Sexual activity: Yes    Birth control/protection: Surgical  Lifestyle  . Physical activity    Days per week: Not on file    Minutes per session: Not on file  . Stress: Not on file  Relationships  . Social Herbalist on phone: Not on file    Gets together: Not on file    Attends  religious service: Not on file    Active member of club or organization: Not on file    Attends meetings of clubs or organizations: Not on file    Relationship status: Not on file  . Intimate partner violence    Fear of current or ex partner: Not on file    Emotionally abused: Not on file    Physically abused: Not on file    Forced sexual activity: Not on file  Other Topics Concern  . Not on file  Social History Narrative  . Not on file     Physical Exam  Vital Signs and Nursing Notes reviewed Vitals:   04/23/19 1159  BP: (!) 142/83  Pulse: 70  Resp: 16  Temp: 98.2 F (36.8 C)  SpO2: 100%    CONSTITUTIONAL: Well-appearing, NAD NEURO:  Alert and oriented x 3, no focal deficits EYES:  eyes equal and reactive ENT/NECK:  no LAD, no JVD CARDIO: Regular rate, well-perfused, normal S1 and S2 PULM:  CTAB no wheezing or rhonchi GI/GU:  normal bowel sounds, non-distended, non-tender MSK/SPINE:  No gross deformities, no  edema SKIN: Hyperpigmented papules to the chest, torso, back PSYCH:  Appropriate speech and behavior  Diagnostic and Interventional Summary    EKG Interpretation  Date/Time:  Sunday April 23 2019 13:50:18 EST Ventricular Rate:  55 PR Interval:    QRS Duration: 80 QT Interval:  404 QTC Calculation: 387 R Axis:   83 Text Interpretation: Sinus rhythm Low voltage, precordial leads Confirmed by Kennis Carina [phone]) on 04/23/2019 1:57:40 PM      Labs Reviewed  COMPREHENSIVE METABOLIC PANEL  CBC WITH DIFFERENTIAL/PLATELET  TSH    XR Chest Single View  Final Result      Medications - No data to display   Procedures  /  Critical Care Procedures  ED Course and Medical Decision Making  I have reviewed the triage vital signs and the nursing notes.  Pertinent labs & imaging results that were available during my care of the patient were reviewed by me and considered in my medical decision making (see below for details).     Normal vital signs, full range of motion of all joints with no evidence to suggest septic joint.  Abdomen soft and nontender.  Chronic rash of unclear etiology.  Could be related to side effect of Humira, had also considered serum sickness given the muscle aches.  Patient also endorses intermittent fever.  DRESS also considered.  Will screen with labs, EKG, chest x-ray, anticipating discharge with rheumatology follow-up tomorrow.    Elmer Sow. Pilar Plate, MD [facility] Emergency Medicine [facility] [email or web address]  Final Clinical Impressions(s) / ED Diagnoses     ICD-10-CM   1. Rash  R21   2. Cough  R05 XR Chest Single View    XR Chest Single View  3. Arthralgia, unspecified joint  M25.50     ED Discharge Orders    None       Discharge Instructions Discussed with and Provided to Patient:     Discharge Instructions     You were evaluated in the Emergency Department and after careful evaluation, we did not find any emergent  condition requiring admission or further testing in the hospital.  Your exam/testing today is overall reassuring.  Please keep your rheumatology appointment tomorrow to discuss your symptoms further.  Please return to the Emergency Department if you experience any worsening of your condition.  We encourage you to follow up with a primary  care provider.  Thank you for allowing [place] to be a part of your care.       Sabas Sous, MD 04/23/19 1451

## 2019-04-23 NOTE — Discharge Instructions (Addendum)
You were evaluated in the Emergency Department and after careful evaluation, we did not find any emergent condition requiring admission or further testing in the hospital.  Your exam/testing today is overall reassuring.  Please keep your rheumatology appointment tomorrow to discuss your symptoms further.  Please return to the Emergency Department if you experience any worsening of your condition.  We encourage you to follow up with a primary care provider.  Thank you for allowing [place] to be a part of your care.

## 2019-06-28 ENCOUNTER — Emergency Department (HOSPITAL_COMMUNITY)
Admission: EM | Admit: 2019-06-28 | Discharge: 2019-06-28 | Disposition: A | Discharge status: Home / Self Care | Payer: Medicaid Other | Attending: Emergency Medicine | Admitting: Emergency Medicine

## 2019-06-28 ENCOUNTER — Emergency Department (HOSPITAL_COMMUNITY): Payer: Medicaid Other

## 2019-06-28 ENCOUNTER — Other Ambulatory Visit: Payer: Self-pay

## 2019-06-28 ENCOUNTER — Encounter (HOSPITAL_COMMUNITY): Payer: Self-pay | Admitting: Emergency Medicine

## 2019-06-28 DIAGNOSIS — R110 Nausea: Secondary | ICD-10-CM | POA: Insufficient documentation

## 2019-06-28 DIAGNOSIS — F17210 Nicotine dependence, cigarettes, uncomplicated: Secondary | ICD-10-CM | POA: Diagnosis not present

## 2019-06-28 DIAGNOSIS — R0602 Shortness of breath: Secondary | ICD-10-CM | POA: Insufficient documentation

## 2019-06-28 DIAGNOSIS — R0789 Other chest pain: Secondary | ICD-10-CM | POA: Diagnosis not present

## 2019-06-28 DIAGNOSIS — R002 Palpitations: Secondary | ICD-10-CM | POA: Insufficient documentation

## 2019-06-28 DIAGNOSIS — M79602 Pain in left arm: Secondary | ICD-10-CM | POA: Diagnosis not present

## 2019-06-28 DIAGNOSIS — R079 Chest pain, unspecified: Secondary | ICD-10-CM

## 2019-06-28 LAB — CBC
HCT: 42.9 % (ref 36.0–46.0)
Hemoglobin: 14.5 g/dL (ref 12.0–15.0)
MCH: 28.9 pg (ref 26.0–34.0)
MCHC: 33.8 g/dL (ref 30.0–36.0)
MCV: 85.5 fL (ref 80.0–100.0)
Platelets: 337 10*3/uL (ref 150–400)
RBC: 5.02 MIL/uL (ref 3.87–5.11)
RDW: 13.5 % (ref 11.5–15.5)
WBC: 8.3 10*3/uL (ref 4.0–10.5)
nRBC: 0 % (ref 0.0–0.2)

## 2019-06-28 LAB — TROPONIN I (HIGH SENSITIVITY)
Troponin I (High Sensitivity): <2 ng/L (ref <18)
Troponin I (High Sensitivity): <2 ng/L (ref <18)

## 2019-06-28 LAB — I-STAT BETA HCG BLOOD, ED (MC, WL, AP ONLY): I-stat hCG, quantitative: <5 m[IU]/mL (ref <5)

## 2019-06-28 LAB — BASIC METABOLIC PANEL
Anion gap: 9 (ref 5–15)
BUN: 6 mg/dL (ref 6–20)
CO2: 27 mmol/L (ref 22–32)
Calcium: 9.7 mg/dL (ref 8.9–10.3)
Chloride: 102 mmol/L (ref 98–111)
Creatinine, Ser: 0.9 mg/dL (ref 0.44–1.00)
GFR calc Af Amer: >60 mL/min (ref >60)
GFR calc non Af Amer: >60 mL/min (ref >60)
Glucose, Bld: 88 mg/dL (ref 70–99)
Potassium: 3.8 mmol/L (ref 3.5–5.1)
Sodium: 138 mmol/L (ref 135–145)

## 2019-06-28 MED ORDER — SODIUM CHLORIDE 0.9% FLUSH: 3.0000 mL | Freq: Once | INTRAVENOUS | Status: DC

## 2019-06-28 NOTE — ED Triage Notes (Signed)
Pt arrives to ED with c/c of palpations and left arm pain for over 1 week. Pt also reports productive cough.

## 2019-06-28 NOTE — ED Provider Notes (Signed)
[facility] EMERGENCY DEPARTMENT Provider Note   CSN: [id number] Arrival date & time: 06/28/19  1313     History Chief Complaint  Patient presents with  . Palpitations    Janice Pena is a 34 y.o. female with a past medical history significant for anxiety, GERD, RA, vitamin D deficiency, and asthma who presents to the ED due to palpitations associated with chest pain x2 days.  Patient states her palpitations feel like an irregular heart rhythm.  She notes she has been having chest pain which she describes as tightness for the past 2 days all across the anterior portion of her chest and is associated with her palpitations.  Denies association with exertion.  Patient notes her chest tightness is intermittent in nature and occurs for a couple minutes at a time.  Admits to associated shortness of breath and nausea during the palpitations and chest pain.  Denies numbness and tingling.  Patient also admits to 3 episodes of nonbloody diarrhea yesterday.  Admits to chills, but denies fever.  No history of blood clots, recent surgeries, recent long immobilizations, and hormonal treatments.  Patient states she had a similar episode like this in September where doctors believed it was related to her Humira.  Patient states she restarted her Humira on Friday right when her symptoms started.  Patient denies recent illness.  She admits to tobacco use.  Patient also complains of left shoulder pain x1 week.  She describes it as a "big iron on left shoulder".  She notes the pain began to radiate down her left arm today.  Denies association with chest pain and palpitations and occurs independently of them.  Denies injury to left arm.    Past Medical History:  Diagnosis Date  . Anxiety   . Anxiety   . Asthma   . Asthma   . Bilateral ovarian cysts   . GERD (gastroesophageal reflux disease)   . HSV infection   . Left wrist pain   . Rheumatoid arteritis (HCC)   . Vitamin D deficiency       Patient Active Problem List   Diagnosis Date Noted  . S/P hysterectomy for AUB 03/28/2014  . Ovarian cyst 03/28/2014    Past Surgical History:  Procedure Laterality Date  . ABDOMINAL HYSTERECTOMY       OB History    Gravida  2   Para  2   Term      Preterm      AB      Living  2     SAB      TAB      Ectopic      Multiple      Live Births              Family History  Problem Relation Age of Onset  . Hypertension Mother   . Hyperlipidemia Mother   . Asthma Mother   . Anxiety disorder Mother   . Depression Mother   . Hypertension Father   . Hyperlipidemia Father   . Asthma Father     Social History   Tobacco Use  . Smoking status: Current Every Day Smoker    Packs/day: 0.50    Years: 13.00    Pack years: 6.50    Types: Cigarettes    Start date: 2003    Last attempt to quit: 06/03/2014    Years since quitting: 5.0  . Smokeless tobacco: Never Used  . Tobacco comment: 6-7 cigs per day as  of 05/26/17  ep  Substance Use Topics  . Alcohol use: No    Alcohol/week: 0.0 standard drinks  . Drug use: No    Home Medications Prior to Admission medications   Medication Sig Start Date End Date Taking? Authorizing Provider  Adalimumab 40 MG/0.4ML PNKT Inject 40 mg into the skin every 14 (fourteen) days. 06/14/19  Yes [provider]  albuterol (PROVENTIL HFA;VENTOLIN HFA) 108 (90 Base) MCG/ACT inhaler Inhale 1-2 puffs into the lungs every 6 (six) hours as needed for wheezing. Patient taking differently: Inhale 1-2 puffs into the lungs every 6 (six) hours as needed for wheezing or shortness of breath.  07/20/16  Yes Rolland Porter, MD  clindamycin-benzoyl peroxide [facility]) gel Apply 1 application topically See admin instructions. Apply as directed to chest and back two times a day 06/09/19  Yes [provider]  gabapentin (NEURONTIN) 600 MG tablet Take 600 mg by mouth 3 (three) times daily as needed (for pain).    Yes [provider]  HYDROcodone-acetaminophen (NORCO/VICODIN) 5-325 MG tablet Take 1 tablet by mouth every 6 (six) hours as needed (for pain).  06/15/19  Yes [provider]  hydrOXYzine (ATARAX/VISTARIL) 25 MG tablet Take 25 mg by mouth at bedtime as needed for anxiety (or sleep).    Yes [provider]  ibuprofen (ADVIL) 200 MG tablet Take 200-800 mg by mouth every 6 (six) hours as needed for headache or mild pain.   Yes [provider]  RETIN-A 0.05 % cream Apply 1 application topically at bedtime as needed (to enhance skin appearance).  06/09/19  Yes [provider]  SYMBICORT 160-4.5 MCG/ACT inhaler Inhale 2 puffs into the lungs 2 (two) times daily.  01/19/19  Yes [provider]  tiZANidine (ZANAFLEX) 2 MG tablet Take 2 mg by mouth 3 (three) times daily as needed for muscle spasms.   Yes [provider]  albuterol (PROVENTIL) (2.5 MG/3ML) 0.083% nebulizer solution Take 3 mLs (2.5 mg total) by nebulization every 6 (six) hours as needed for wheezing or shortness of breath. 07/20/16   Rolland Porter, MD  ibuprofen (ADVIL,MOTRIN) 800 MG tablet Take 1 tablet (800 mg total) by mouth 3 (three) times daily. Patient not taking: Reported on 06/28/2019 10/20/14   Ladona Mow, PA-C    Allergies    Eggs or egg-derived products, Lactose intolerance (gi), Lactulose, Oxycodone, Oxycontin [oxycodone hcl], Doxycycline, and Mixed grasses  Review of Systems   Review of Systems  Constitutional: Positive for chills. Negative for fever.  Respiratory: Positive for shortness of breath.   Cardiovascular: Positive for chest pain and palpitations. Negative for leg swelling.  Gastrointestinal: Positive for diarrhea and nausea. Negative for abdominal pain and vomiting.  Musculoskeletal: Positive for arthralgias (left shoulder pain) and neck pain.  Neurological: Negative for numbness.  All other systems reviewed and are negative.   Physical Exam Updated Vital Signs BP 120/80 (BP Location:  Right Arm)   Pulse 60   Temp 98.3 F (36.8 C) (Oral)   Resp 16   SpO2 100%   Physical Exam Vitals and nursing note reviewed.  Constitutional:      General: She is not in acute distress.    Appearance: She is not ill-appearing.  HENT:     Head: Normocephalic.  Eyes:     Pupils: Pupils are equal, round, and reactive to light.  Cardiovascular:     Rate and Rhythm: Normal rate and regular rhythm.     Pulses: Normal pulses.  Heart sounds: Normal heart sounds. No murmur. No friction rub. No gallop.   Pulmonary:     Effort: Pulmonary effort is normal.     Breath sounds: Normal breath sounds.  Chest:     Comments: No anterior chest wall tenderness. Abdominal:     General: Abdomen is flat. Bowel sounds are normal. There is no distension.     Palpations: Abdomen is soft.     Tenderness: There is no abdominal tenderness. There is no guarding or rebound.  Musculoskeletal:     Cervical back: Neck supple.     Comments: Able to move all 4 extremities without difficulty.  No lower extremity edema.  Negative Homans sign bilaterally.  No tenderness palpation over left shoulder or left arm.  Full range of motion of left shoulder and elbow.  No thoracic or lumbar midline tenderness.  Left upper extremity neurovascularly intact.  No overlying erythema, edema, or warmth.  Skin:    General: Skin is warm and dry.  Neurological:     General: No focal deficit present.     Mental Status: She is alert.  Psychiatric:        Mood and Affect: Mood normal.        Behavior: Behavior normal.     ED Results / Procedures / Treatments   Labs (all labs ordered are listed, but only abnormal results are displayed) Labs Reviewed  BASIC METABOLIC PANEL  CBC  I-STAT BETA HCG BLOOD, ED ([facility], [facility], [facility] ONLY)  TROPONIN I (HIGH SENSITIVITY)  TROPONIN I (HIGH SENSITIVITY)    EKG EKG Interpretation  Date/Time:  Wednesday June 28 2019 13:23:07 EST Ventricular Rate:  71 PR Interval:  144 QRS  Duration: 70 QT Interval:  368 QTC Calculation: 399 R Axis:   78 Text Interpretation: Sinus rhythm with marked sinus arrhythmia Low voltage QRS Cannot rule out Anterior infarct , age undetermined Abnormal ECG Confirmed by Davonna Belling [phone]) on 06/28/2019 4:00:25 PM   Radiology DG Chest 2 View  Result Date: 06/28/2019 CLINICAL DATA:  Pt arrives to ED with c/c of palpations and left arm pain for over 1 week. Pt also reports productive cough. EXAM: CHEST - 2 VIEW COMPARISON:  Chest radiograph 04/23/2019 FINDINGS: The heart size and mediastinal contours are within normal limits. The lungs are clear. No pneumothorax or pleural effusion. The visualized skeletal structures are unremarkable. IMPRESSION: No acute cardiopulmonary disease. Electronically Signed   By: Audie Pinto M.D.   On: 06/28/2019 13:51    Procedures Procedures (including critical care time)  Medications Ordered in ED Medications  sodium chloride flush (NS) 0.9 % injection 3 mL (has no administration in time range)    ED Course  I have reviewed the triage vital signs and the nursing notes.  Pertinent labs & imaging results that were available during my care of the patient were reviewed by me and considered in my medical decision making (see chart for details).    MDM Rules/Calculators/A&P                     34 year old female presents to the ED due to chest pain associated with palpitations x2 days.  She also admits to left shoulder pain for the past week.  Denies injury.  Patient denies history of blood clots, recent surgeries, recent long immobilizations, and hormonal treatments.  All within normal limits.  Patient is afebrile, not tachycardic or hypoxic.  Patient in no acute distress and rather well-appearing.  Physical exam reassuring.  No anterior chest wall tenderness.  No lower extremity edema.  Negative Homans sign bilaterally.  No tenderness palpation over left shoulder or left upper extremity. No erythema,  edema, or warmth. Doubt septic arthritis. Suspect should pain related to RA vs. OA. Shared decision making in regards to left shoulder pain.  Patient does not feel x-rays needed at this time which I agree with given she had no known injury.    CBC unremarkable with no leukocytosis.  BMP unremarkable with normal renal function and no electrolyte abnormalities.  Initial troponin normal.  Will obtain delta to rule out ACS.  X-ray personally reviewed which is negative for signs of pneumonia, widened mediastinum, or pneumothorax.  No change in delta troponin.  Doubt ACS.  Patient has remained in normal sinus rhythm throughout her ED stay.  Repeat EKG personally reviewed which demonstrates normal sinus rhythm with no signs of ischemia.  Will discharge patient with cardiology follow-up.  Suspect palpitations related to SVT or intermittent irregular heart rhythm. Presentation not concerning for PE/DVT.  PERC negative and low risk using Wells criteria.  Advised patient to take over-the-counter ibuprofen or Tylenol as needed for left shoulder pain.  Instructed patient to follow-up with PCP if symptoms do not improve within the next week. Strict ED precautions discussed with patient. Patient states understanding and agrees to plan. Patient discharged home in no acute distress and stable vitals.  Final Clinical Impression(s) / ED Diagnoses Final diagnoses:  Palpitations  Nonspecific chest pain    Rx / DC Orders ED Discharge Orders    None       Jesusita Oka 06/28/19 1704    Benjiman Core, MD 06/29/19 [phone]

## 2019-06-28 NOTE — ED Notes (Signed)
Pt reports that  The pt ia ready for discharge

## 2019-06-28 NOTE — Discharge Instructions (Addendum)
As discussed, all of your labs, chest x-ray, and EKG looked good today. Your left shoulder pain is most likely related to RA or osteoarthritis. You may take over the counter ibuprofen or Tylenol as needed for pain. I have included the number of the cardiologist. Call tomorrow to schedule an appointment. Return to the ER for new or worsening symptoms.

## 2019-08-01 ENCOUNTER — Other Ambulatory Visit (HOSPITAL_COMMUNITY): Payer: Self-pay | Admitting: Otolaryngology

## 2019-08-01 ENCOUNTER — Other Ambulatory Visit: Payer: Self-pay | Admitting: Otolaryngology

## 2019-08-01 DIAGNOSIS — R1310 Dysphagia, unspecified: Secondary | ICD-10-CM

## 2019-08-22 ENCOUNTER — Ambulatory Visit (HOSPITAL_COMMUNITY)
Admission: RE | Admit: 2019-08-22 | Discharge: 2019-08-22 | Disposition: A | Discharge status: Home / Self Care | Payer: Medicaid Other | Source: Ambulatory Visit | Attending: Otolaryngology | Admitting: Otolaryngology

## 2019-08-22 ENCOUNTER — Other Ambulatory Visit: Payer: Self-pay

## 2019-08-22 DIAGNOSIS — R1310 Dysphagia, unspecified: Secondary | ICD-10-CM | POA: Insufficient documentation

## 2019-10-11 ENCOUNTER — Other Ambulatory Visit (HOSPITAL_BASED_OUTPATIENT_CLINIC_OR_DEPARTMENT_OTHER): Payer: Self-pay

## 2019-10-11 DIAGNOSIS — G4730 Sleep apnea, unspecified: Secondary | ICD-10-CM

## 2019-11-15 ENCOUNTER — Other Ambulatory Visit (HOSPITAL_COMMUNITY): Payer: Medicaid Other

## 2019-11-17 ENCOUNTER — Other Ambulatory Visit: Payer: Self-pay

## 2019-11-17 ENCOUNTER — Ambulatory Visit (HOSPITAL_BASED_OUTPATIENT_CLINIC_OR_DEPARTMENT_OTHER): Payer: Medicaid Other | Attending: Physician Assistant | Admitting: Internal Medicine

## 2019-11-17 VITALS — Ht 65.0 in | Wt 170.0 lb

## 2019-11-17 DIAGNOSIS — Z9189 Other specified personal risk factors, not elsewhere classified: Secondary | ICD-10-CM | POA: Diagnosis not present

## 2019-11-17 DIAGNOSIS — J45909 Unspecified asthma, uncomplicated: Secondary | ICD-10-CM | POA: Diagnosis not present

## 2019-11-17 DIAGNOSIS — G478 Other sleep disorders: Secondary | ICD-10-CM

## 2019-11-21 ENCOUNTER — Other Ambulatory Visit (HOSPITAL_BASED_OUTPATIENT_CLINIC_OR_DEPARTMENT_OTHER): Payer: Self-pay

## 2019-11-21 DIAGNOSIS — G4730 Sleep apnea, unspecified: Secondary | ICD-10-CM

## 2019-11-26 DIAGNOSIS — G4733 Obstructive sleep apnea (adult) (pediatric): Secondary | ICD-10-CM

## 2019-11-26 NOTE — Procedures (Signed)
   Patient Name: Janice Pena, Janice Pena Date: 11/17/2019 Gender: Female D.O.B: 02-Jun-1985 Age (years): 34 Referring Provider: Norva Riffle Height (inches): 55 Interpreting Physician: Jetty Duhamel MD, ABSM Weight (lbs): 170 RPSGT: Rolene Arbour BMI: 40 MRN: [id number] Neck Size: 16.00  CLINICAL INFORMATION Sleep Study Type: NPSG Indication for sleep study: Choke and stop breathing in sleep Epworth Sleepiness Score: 6  SLEEP STUDY TECHNIQUE As per the AASM Manual for the Scoring of Sleep and Associated Events v2.3 (April 2016) with a hypopnea requiring 4% desaturations.  The channels recorded and monitored were frontal, central and occipital EEG, electrooculogram (EOG), submentalis EMG (chin), nasal and oral airflow, thoracic and abdominal wall motion, anterior tibialis EMG, snore microphone, electrocardiogram, and pulse oximetry.  MEDICATIONS Medications self-administered by patient taken the night of the study : none reported  SLEEP ARCHITECTURE The study was initiated at 10:08:06 PM and ended at 4:39:40 AM.  Sleep onset time was 69.9 minutes and the sleep efficiency was 64.0%%. The total sleep time was 250.7 minutes.  Stage REM latency was 56.5 minutes.  The patient spent 0.8%% of the night in stage N1 sleep, 70.7%% in stage N2 sleep, 10.8%% in stage N3 and 17.8% in REM.  Alpha intrusion was absent.  Supine sleep was 94.02%.  RESPIRATORY PARAMETERS The overall apnea/hypopnea index (AHI) was 0.0 per hour. There were 0 total apneas, including 0 obstructive, 0 central and 0 mixed apneas. There were 0 hypopneas and 1 RERAs.  The AHI during Stage REM sleep was 0.0 per hour.  AHI while supine was 0.0 per hour.  The mean oxygen saturation was 97.9%. The minimum SpO2 during sleep was 95.0%.  soft snoring was noted during this study.  CARDIAC DATA The 2 lead EKG demonstrated sinus rhythm. The mean heart rate was 65.9 beats per minute. Other EKG findings include:  None.  LEG MOVEMENT DATA The total PLMS were 0 with a resulting PLMS index of 0.0. Associated arousal with leg movement index was 0.0 .  IMPRESSIONS - No significant obstructive sleep apnea occurred during this study (AHI = 0.0/h). - No significant central sleep apnea occurred during this study (CAI = 0.0/h). - The patient had minimal or no oxygen desaturation during the study (Min O2 = 95.0%) - The patient snored with soft snoring volume. - No cardiac abnormalities were noted during this study. - Clinically significant periodic limb movements did not occur during sleep. No significant associated arousals. - Patient had difficulty initiating and maintaining sleep and complained of being cold despite thermostat turned up and extra blankets.  DIAGNOSIS - Normal study  RECOMMENDATIONS - Manage for symptoms based on clinical judgment. Study can be repeated inthe future if concern remains. - Sleep hygiene should be reviewed to assess factors that may improve sleep quality. - Weight management and regular exercise should be initiated or continued if appropriate.  [Electronically signed] 11/26/2019 11:16 AM  Jetty Duhamel MD, ABSM Diplomate, American Board of Sleep Medicine   NPI: [id number]                          Jetty Duhamel Diplomate, American Board of Sleep Medicine  ELECTRONICALLY SIGNED ON:  11/26/2019, 11:10 AM [facility] PH: [phone]   FX: [phone] ACCREDITED BY THE AMERICAN ACADEMY OF SLEEP MEDICINE

## 2021-02-09 IMAGING — DX DG CHEST 1V PORT
1 series · 1 of 1 positions shown · non-contrast
Comparison: 01/12/2019

CLINICAL DATA: Rash.

EXAM:
PORTABLE CHEST 1 VIEW

[chest ap]
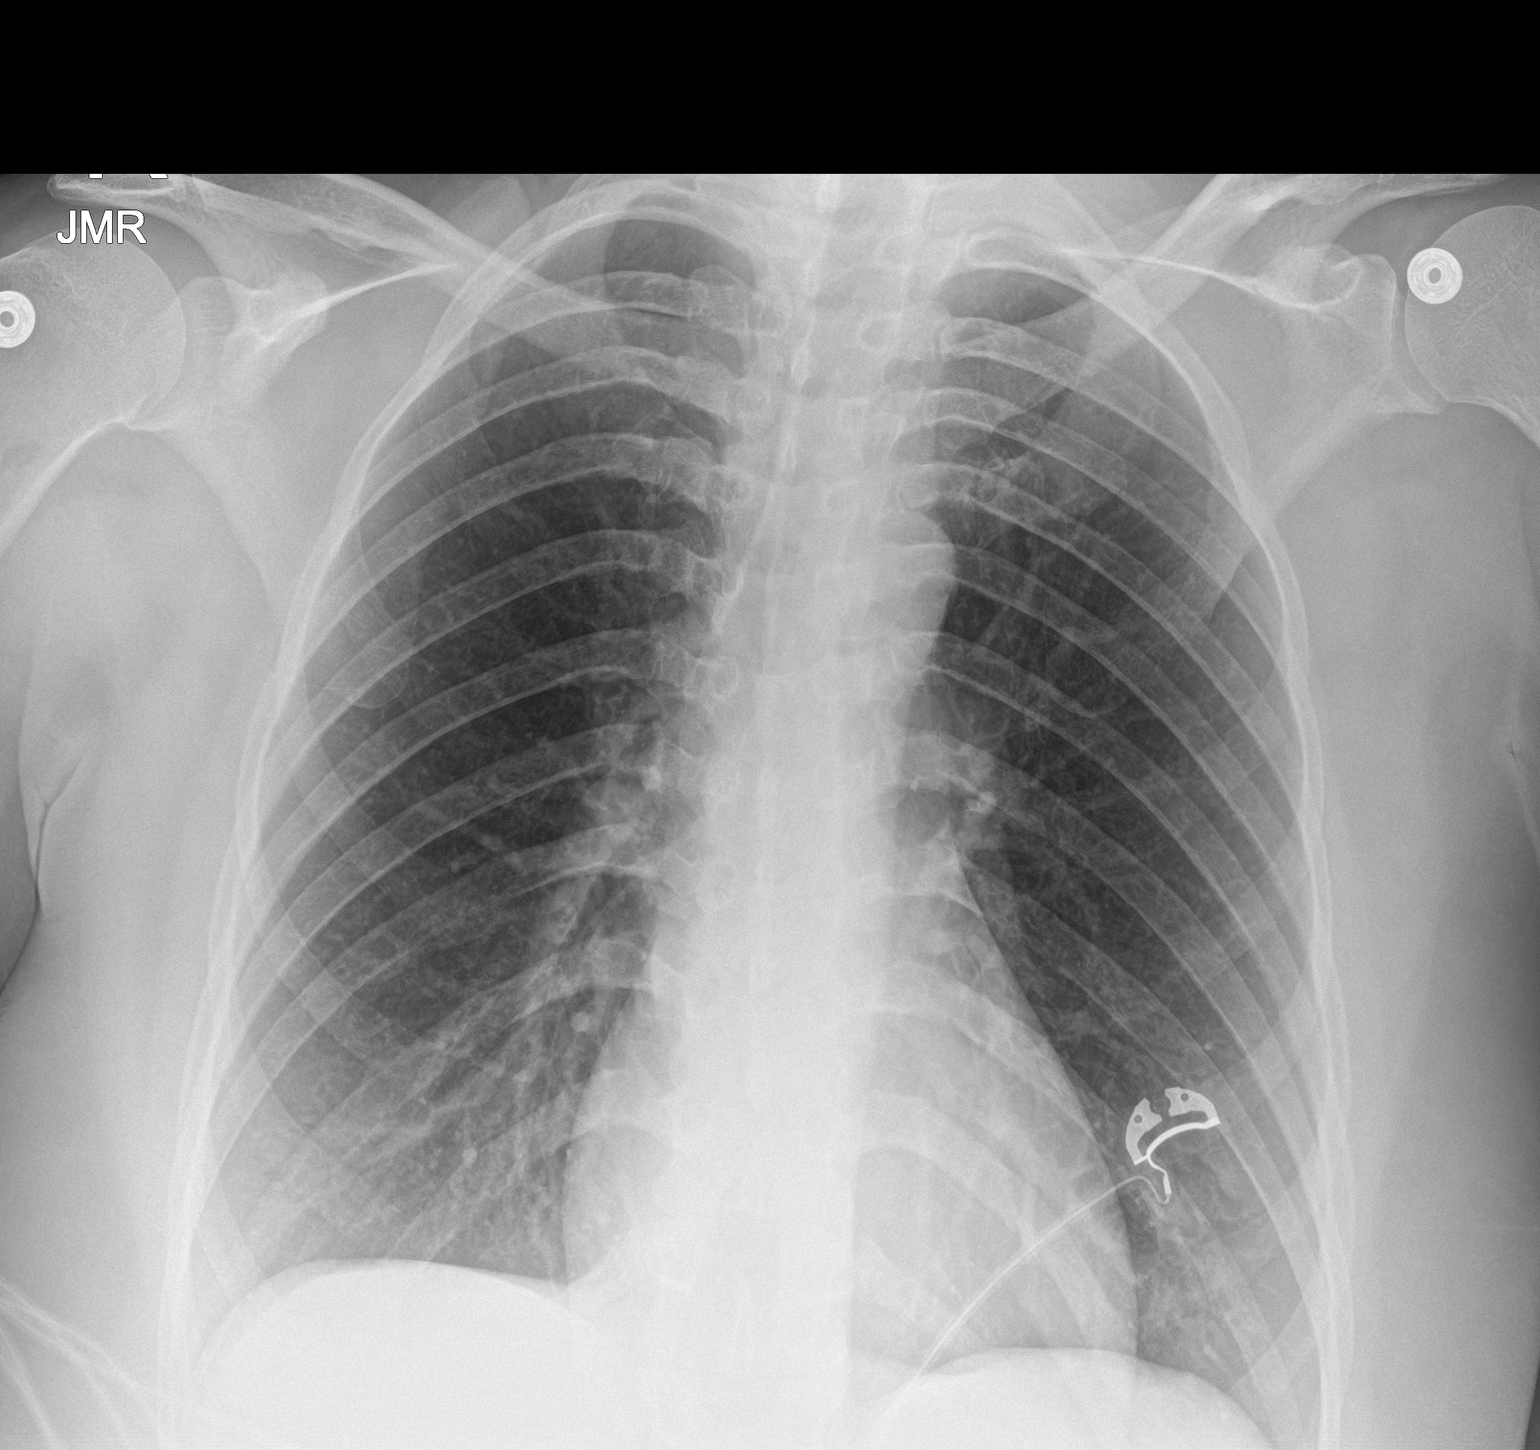

[1 of 1 positions shown; findings below may reference images not displayed]

FINDINGS: The heart size and mediastinal contours are within normal limits.
Both lungs are clear. The visualized skeletal structures are
unremarkable.
IMPRESSION: No acute cardiopulmonary disease.

## 2021-07-09 ENCOUNTER — Ambulatory Visit: Payer: 59 | Admitting: Cardiology

## 2021-07-09 NOTE — Progress Notes (Signed)
Date:  07/10/2021   ID:  Janice Pena, DOB 1985-08-22, MRN [id number]  PCP:  Benito Mccreedy, MD  Cardiologist:  Rex Kras, DO, [facility] (established care 07/10/2021)  REASON FOR CONSULT: Palpitations  REQUESTING PHYSICIAN:  Benito Mccreedy, MD [address] [place],  [place] [address]  Chief Complaint  Patient presents with   Palpitations   New Patient (Initial Visit)    HPI  Janice Pena is a 36 y.o. African-American female who presents to the office with a chief complaint of "palpitations." Patient's past medical history and cardiovascular risk factors include: Hypertension depression/anxiety, GERD, asthma, rheumatoid arthritis, fibromyalgia, current smoker, asthma.  She is referred to the office at the request of Osei-Bonsu, Iona Beard, MD for evaluation of palpitations.  Palpitations: Present for the last several years, has a long history of thyroid disease status post thyroidectomy 2021.  Her thyroid function was originally followed by endocrinology and now transitioned care to PCP.  She has palpitations on a daily basis, predominantly present at night, may last anywhere from seconds to hours, self-limited.  Better with inhalers, and worse with lying on her left side.  Denies any near-syncope or syncopal event.  No prior cardiovascular work-up.  In the past her TSH was slightly greater than 11 which is improved now to 7.33 as of December 2022.  She also consumes 1 L of Dr. Malachi Bonds daily, drinks 1 cup of coffee or tea. No use of herbal supplements, stimulants, weight loss supplements, or energy drinks.  Chest pain: Present for the last several years, located over the left anterior precordium, usually present when she lays on the left side, duration seconds to hours, 7 out of 10 in intensity, nonexertional, does not resolve with rest, self-limited.  Left-sided numbness: Patient states that when she lays on her left side she has numbness involving the left  upper and lower extremity.  She is concerned that she may have underlying vascular pathology and would like to be evaluated.  The duration is for the last several years as well.  At times has difficulty with overhead activities.  No prior neurology evaluation.  No formal diagnosis of radiculopathy/neuropathy.  She does have fibromyalgia.  She smokes half a pack per day.  FUNCTIONAL STATUS: No structured exercise program or daily routine.   ALLERGIES: Allergies  Allergen Reactions   Eggs Or Egg-Derived Products Diarrhea   Lactose Intolerance (Gi) Diarrhea   Lactulose Diarrhea   Oxycodone Hives   Oxycontin [Oxycodone Hcl] Hives   Doxycycline Rash    Trunk rash (this is one of the 2 suspected sources)   Mixed Grasses Rash    MEDICATION LIST PRIOR TO VISIT: Current Meds  Medication Sig   Adalimumab 40 MG/0.4ML PNKT Inject 40 mg into the skin every 7 (seven) days.   albuterol (PROVENTIL HFA;VENTOLIN HFA) 108 (90 Base) MCG/ACT inhaler Inhale 1-2 puffs into the lungs every 6 (six) hours as needed for wheezing. (Patient taking differently: Inhale 1-2 puffs into the lungs every 6 (six) hours as needed for wheezing or shortness of breath.)   albuterol (PROVENTIL) (2.5 MG/3ML) 0.083% nebulizer solution Take 3 mLs (2.5 mg total) by nebulization every 6 (six) hours as needed for wheezing or shortness of breath.   azithromycin (ZITHROMAX) 1 g powder Take 1 g by mouth once.   cycloSPORINE (RESTASIS) 0.05 % ophthalmic emulsion Place 1 drop into both eyes daily at 12 noon.   gabapentin (NEURONTIN) 600 MG tablet Take 600 mg by mouth 3 (three) times daily as  needed (for pain).    HYDROcodone-acetaminophen (NORCO/VICODIN) 5-325 MG tablet Take 1 tablet by mouth every 6 (six) hours as needed (for pain).    hydrOXYzine (ATARAX/VISTARIL) 25 MG tablet Take 25 mg by mouth at bedtime as needed for anxiety (or sleep).    ibuprofen (ADVIL) 200 MG tablet Take 200-800 mg by mouth every 6 (six) hours as needed for  headache or mild pain.   levothyroxine (SYNTHROID) 150 MCG tablet Take 150 mcg by mouth daily before breakfast.   promethazine-dextromethorphan (PROMETHAZINE-DM) 6.25-15 MG/5ML syrup Take by mouth 4 (four) times daily as needed for cough.   tiZANidine (ZANAFLEX) 2 MG tablet Take 2 mg by mouth 3 (three) times daily as needed for muscle spasms.     PAST MEDICAL HISTORY: Past Medical History:  Diagnosis Date   Anxiety    Anxiety    Asthma    Asthma    Bilateral ovarian cysts    Fibromyalgia    GERD (gastroesophageal reflux disease)    HSV infection    Left wrist pain    Rheumatoid arteritis ([place])    Vitamin D deficiency     PAST SURGICAL HISTORY: Past Surgical History:  Procedure Laterality Date   ABDOMINAL HYSTERECTOMY     THYROIDECTOMY      FAMILY HISTORY: The patient family history includes Anxiety disorder in her mother; Asthma in her father and mother; Depression in her mother; Heart murmur in her brother; Hyperlipidemia in her father and mother; Hypertension in her father and mother.  SOCIAL HISTORY:  The patient  reports that she has been smoking cigarettes. She started smoking about 20 years ago. She has a 6.50 pack-year smoking history. She has never used smokeless tobacco. She reports that she does not drink alcohol and does not use drugs.  REVIEW OF SYSTEMS: Review of Systems  Cardiovascular:  Positive for dyspnea on exertion and palpitations. Negative for chest pain, leg swelling, orthopnea, paroxysmal nocturnal dyspnea and syncope.  Respiratory:  Positive for cough, shortness of breath and wheezing.    PHYSICAL EXAM: Vitals with BMI 07/10/2021 07/10/2021 11/17/2019  Height - 5' 5"  5' 5"   Weight - 172 lbs 3 oz 170 lbs  BMI - 00.71 21.97  Systolic 588 325 -  Diastolic 87 93 -  Pulse 94 99 -    CONSTITUTIONAL: Well-developed and well-nourished. No acute distress.  SKIN: Skin is warm and dry. No rash noted. No cyanosis. No pallor. No jaundice HEAD: Normocephalic  and atraumatic.  EYES: No scleral icterus MOUTH/THROAT: Moist oral membranes.  NECK: No JVD present. No thyromegaly noted. No carotid bruits  LYMPHATIC: No visible cervical adenopathy.  CHEST Normal respiratory effort. No intercostal retractions  LUNGS: Expiratory wheezes bilateral and rhonic  CARDIOVASCULAR: Regular rate and rhythm, positive S1-S2, no murmurs rubs or gallops appreciated. ABDOMINAL: Obese, soft, nontender, nondistended, positive bowel sounds all 4 quadrants. No apparent ascites.  EXTREMITIES: No peripheral edema, warm to touch, +2 bilateral radial, brachial, subclavian, carotids, DPand PT pulses HEMATOLOGIC: No significant bruising NEUROLOGIC: Oriented to person, place, and time. Nonfocal. Normal muscle tone.  PSYCHIATRIC: Normal mood and affect. Normal behavior. Cooperative  CARDIAC DATABASE: EKG: 07/10/2021: NSR, 79 bpm, normal axis, RAE, poor R wave progression.   Echocardiogram: No results found for this or any previous visit from the past 1095 days.  Stress Testing: No results found for this or any previous visit from the past 1095 days.  Heart Catheterization: None  LABORATORY DATA: CBC Latest Ref Rng & Units 06/28/2019 04/23/2019 01/12/2019  WBC 4.0 - 10.5 K/uL 8.3 7.6 9.5  Hemoglobin 12.0 - 15.0 g/dL 14.5 14.5 15.5(H)  Hematocrit 36.0 - 46.0 % 42.9 42.6 44.8  Platelets 150 - 400 K/uL 337 289 334    CMP Latest Ref Rng & Units 06/28/2019 04/23/2019 01/12/2019  Glucose 70 - 99 mg/dL 88 88 104(H)  BUN 6 - 20 mg/dL 6 8 7   Creatinine 0.44 - 1.00 mg/dL 0.90 0.88 0.86  Sodium 135 - 145 mmol/L 138 138 139  Potassium 3.5 - 5.1 mmol/L 3.8 3.8 3.5  Chloride 98 - 111 mmol/L 102 106 104  CO2 22 - 32 mmol/L 27 24 25   Calcium 8.9 - 10.3 mg/dL 9.7 9.3 9.7  Total Protein 6.5 - 8.1 g/dL - 6.9 -  Total Bilirubin 0.3 - 1.2 mg/dL - 0.5 -  Alkaline Phos 38 - 126 U/L - 84 -  AST 15 - 41 U/L - 19 -  ALT 0 - 44 U/L - 18 -    Lipid Panel  No results found for: CHOL, TRIG,  HDL, CHOLHDL, VLDL, LDLCALC, LDLDIRECT, LABVLDL  No components found for: NTPROBNP No results for input(s): PROBNP in the last 8760 hours. No results for input(s): TSH in the last 8760 hours.  BMP No results for input(s): NA, K, CL, CO2, GLUCOSE, BUN, CREATININE, CALCIUM, GFRNONAA, GFRAA in the last 8760 hours.  HEMOGLOBIN A1C No results found for: HGBA1C, MPG  External Labs:  Date Collected: 05/01/2021 , information obtained by [facility], [place] Potassium: 3.7 Creatinine 0.88 mg/dL. eGFR: 87 mL/min per 1.73 m Hemoglobin: 14.5 g/dL and hematocrit: 42.3 % AST: 9 , ALT: 8 , alkaline phosphatase: 87  TSH 7.33  External Labs: Collected: 03/26/2021 available in [place]. TSH 11.73 Free T40.8 Sodium 138, potassium 4, chloride 105, bicarb 28, BUN 6, creatinine 0.91  Collected: 06/12/2021 available in care everywhere [place]. Creatinine 0.92 Hemoglobin 14.7 g/dL, hematocrit 42.5%   IMPRESSION:    ICD-10-CM   1. Palpitations  R00.2 EKG 12-Lead    LONG TERM MONITOR (3-14 DAYS)    2. Precordial pain  R07.2 PCV ECHOCARDIOGRAM COMPLETE    PCV CARDIAC STRESS TEST    DG Chest 2 View    3. Shortness of breath  R06.02 B Nat Peptide    4. Numbness  R20.0     5. Hypothyroidism, unspecified type  E03.9     6. Cigarette smoker  F17.210        RECOMMENDATIONS: Nashali Ditmer is a 36 y.o. African-American female whose past medical history and cardiac risk factors include: Hypertension depression/anxiety, GERD, asthma, rheumatoid arthritis, fibromyalgia, current smoker, asthma.  Both palpitations and precordial discomfort has been present for the last several years.  I suspect that her underlying cause is palpitations that are more prevalent at night.  She also has underlying thyroid disease which is currently being addressed and the last TSH was 7.33.  I have also encouraged her to have her  underlying anxiety reevaluated and consider pharmacological therapy.  In addition, she strongly encouraged to reduce her Dr. Malachi Bonds intake (currently drinks 1 L/day).    On physical examination she has expiratory wheezes noted bilateral lung fields I suspect that she may have underlying URI.  Recommend chest x-ray and BNP. Orders have been placed, patient states that she may have it done at her PCPs is she to recommended the same.  Reemphasized importance of complete smoking cessation.  Also recommended that she get tested  for COVID-19.  7-day extended Holter monitor to evaluate for underlying dysrhythmias.  Echo will be ordered to evaluate for structural heart disease and left ventricular systolic function.  Patient precordial discomfort appears to be noncardiac.  However she is very concerned and would like to be evaluated further.  I recommended that she address her pulmonary condition for someone stable scheduled.  Exercise treadmill stress test to evaluate for exercise-induced arrhythmia/ischemia.  With regards for numbness have asked her to discuss it further with PCP and/or neurology to see if she has any radiculopathy or neuropathy that could explain her symptoms.  She also has underlying fibromyalgia which may be contributing.  Her vascular examination is acceptable.  If symptoms still continue despite being evaluated for palpitations/chest pain/shortness of breath may consider ABI and upper extremity duplex.  Review of electronic medical records also notes symptoms of palpitations, precordial discomfort, left shoulder pain dating back to 2021 or further.  Recommend follow-up in 6 weeks or sooner if change in clinical condition.  Total time spent: 50 minutes discussing multiple chief complaints, review of external office notes provided by referring physician and labs independently reviewed and noted above, last ED visit in 2021, independently ordered and reviewed EKG, ordering additional  diagnostic work-up, discussing disease management, addressing her questions and concerns, coordination of care.  FINAL MEDICATION LIST END OF ENCOUNTER: No orders of the defined types were placed in this encounter.   Medications Discontinued During This Encounter  Medication Reason   clindamycin-benzoyl peroxide (BENZACLIN) gel    ibuprofen (ADVIL,MOTRIN) 800 MG tablet    RETIN-A 0.05 % cream    SYMBICORT 160-4.5 MCG/ACT inhaler      Current Outpatient Medications:    Adalimumab 40 MG/0.4ML PNKT, Inject 40 mg into the skin every 7 (seven) days., Disp: , Rfl:    albuterol (PROVENTIL HFA;VENTOLIN HFA) 108 (90 Base) MCG/ACT inhaler, Inhale 1-2 puffs into the lungs every 6 (six) hours as needed for wheezing. (Patient taking differently: Inhale 1-2 puffs into the lungs every 6 (six) hours as needed for wheezing or shortness of breath.), Disp: 1 Inhaler, Rfl: 0   albuterol (PROVENTIL) (2.5 MG/3ML) 0.083% nebulizer solution, Take 3 mLs (2.5 mg total) by nebulization every 6 (six) hours as needed for wheezing or shortness of breath., Disp: 75 mL, Rfl: 0   azithromycin (ZITHROMAX) 1 g powder, Take 1 g by mouth once., Disp: , Rfl:    cycloSPORINE (RESTASIS) 0.05 % ophthalmic emulsion, Place 1 drop into both eyes daily at 12 noon., Disp: , Rfl:    gabapentin (NEURONTIN) 600 MG tablet, Take 600 mg by mouth 3 (three) times daily as needed (for pain). , Disp: , Rfl:    HYDROcodone-acetaminophen (NORCO/VICODIN) 5-325 MG tablet, Take 1 tablet by mouth every 6 (six) hours as needed (for pain). , Disp: , Rfl:    hydrOXYzine (ATARAX/VISTARIL) 25 MG tablet, Take 25 mg by mouth at bedtime as needed for anxiety (or sleep). , Disp: , Rfl:    ibuprofen (ADVIL) 200 MG tablet, Take 200-800 mg by mouth every 6 (six) hours as needed for headache or mild pain., Disp: , Rfl:    levothyroxine (SYNTHROID) 150 MCG tablet, Take 150 mcg by mouth daily before breakfast., Disp: , Rfl:    promethazine-dextromethorphan  (PROMETHAZINE-DM) 6.25-15 MG/5ML syrup, Take by mouth 4 (four) times daily as needed for cough., Disp: , Rfl:    tiZANidine (ZANAFLEX) 2 MG tablet, Take 2 mg by mouth 3 (three) times daily as needed for muscle spasms., Disp: ,  Rfl:   Orders Placed This Encounter  Procedures   DG Chest 2 View   B Nat Peptide   PCV CARDIAC STRESS TEST   LONG TERM MONITOR (3-14 DAYS)   EKG 12-Lead   PCV ECHOCARDIOGRAM COMPLETE    There are no Patient Instructions on file for this visit.   --Continue cardiac medications as reconciled in final medication list. --Return in about 6 weeks (around 08/21/2021) for Follow up, Palpitations, Dyspnea, Review test results. Or sooner if needed. --Continue follow-up with your primary care physician regarding the management of your other chronic comorbid conditions.  Patient's questions and concerns were addressed to her satisfaction. She voices understanding of the instructions provided during this encounter.   This note was created using a voice recognition software as a result there may be grammatical errors inadvertently enclosed that do not reflect the nature of this encounter. Every attempt is made to correct such errors.  Rex Kras, [place], [facility]  Pager: [phone] Office: [phone]

## 2021-07-10 ENCOUNTER — Encounter: Payer: Self-pay | Admitting: Cardiology

## 2021-07-10 ENCOUNTER — Other Ambulatory Visit: Payer: Self-pay

## 2021-07-10 ENCOUNTER — Ambulatory Visit: Payer: 59 | Admitting: Cardiology

## 2021-07-10 VITALS — BP 126/87 | HR 94 | Temp 97.2°F | Resp 16 | Ht 65.0 in | Wt 172.2 lb

## 2021-07-10 DIAGNOSIS — R072 Precordial pain: Secondary | ICD-10-CM

## 2021-07-10 DIAGNOSIS — F17210 Nicotine dependence, cigarettes, uncomplicated: Secondary | ICD-10-CM

## 2021-07-10 DIAGNOSIS — R200 Anesthesia of skin: Secondary | ICD-10-CM

## 2021-07-10 DIAGNOSIS — R0602 Shortness of breath: Secondary | ICD-10-CM

## 2021-07-10 DIAGNOSIS — E039 Hypothyroidism, unspecified: Secondary | ICD-10-CM

## 2021-07-10 DIAGNOSIS — R002 Palpitations: Secondary | ICD-10-CM

## 2021-07-18 ENCOUNTER — Other Ambulatory Visit: Payer: Self-pay

## 2021-07-18 ENCOUNTER — Ambulatory Visit: Payer: 59

## 2021-07-18 DIAGNOSIS — R072 Precordial pain: Secondary | ICD-10-CM

## 2021-07-25 ENCOUNTER — Other Ambulatory Visit: Payer: Self-pay

## 2021-07-25 ENCOUNTER — Ambulatory Visit: Payer: 59

## 2021-07-25 ENCOUNTER — Inpatient Hospital Stay: Payer: 59

## 2021-07-25 DIAGNOSIS — R002 Palpitations: Secondary | ICD-10-CM

## 2021-07-25 DIAGNOSIS — R072 Precordial pain: Secondary | ICD-10-CM

## 2021-08-11 NOTE — Progress Notes (Deleted)
? ?Synopsis: Referred for worsening asthma by Norm SaltVanstory, Ashley N, PA ? ?Subjective:  ? ?PATIENT ID: Janice Pena GENDER: female DOB: 11/04/1985, MRN: [id number] ? ?No chief complaint on file. ? ?36yF with history of asthma, GERD, ?RA, smoking referred for worsening asthma ? ?Otherwise pertinent review of systems is negative. ? ?Past Medical History:  ?Diagnosis Date  ? Anxiety   ? Anxiety   ? Asthma   ? Asthma   ? Bilateral ovarian cysts   ? Fibromyalgia   ? GERD (gastroesophageal reflux disease)   ? HSV infection   ? Left wrist pain   ? Rheumatoid arteritis ([facility])   ? Vitamin D deficiency   ?  ? ?Family History  ?Problem Relation Age of Onset  ? Hypertension Mother   ? Hyperlipidemia Mother   ? Asthma Mother   ? Anxiety disorder Mother   ? Depression Mother   ? Hypertension Father   ? Hyperlipidemia Father   ? Asthma Father   ? Heart murmur Brother   ?  ? ?Past Surgical History:  ?Procedure Laterality Date  ? ABDOMINAL HYSTERECTOMY    ? THYROIDECTOMY    ? ? ?Social History  ? ?Socioeconomic History  ? Marital status: Married  ?  Spouse name: Not on file  ? Number of children: Not on file  ? Years of education: Not on file  ? Highest education level: Not on file  ?Occupational History  ? Not on file  ?Tobacco Use  ? Smoking status: Every Day  ?  Packs/day: 0.50  ?  Years: 13.00  ?  Pack years: 6.50  ?  Types: Cigarettes  ?  Start date: 2003  ?  Last attempt to quit: 06/03/2014  ?  Years since quitting: 7.1  ? Smokeless tobacco: Never  ? Tobacco comments:  ?  6-7 cigs per day as of 05/26/17  ep  ?Vaping Use  ? Vaping Use: Never used  ?Substance and Sexual Activity  ? Alcohol use: No  ?  Alcohol/week: 0.0 standard drinks  ? Drug use: No  ? Sexual activity: Yes  ?  Birth control/protection: Surgical  ?Other Topics Concern  ? Not on file  ?Social History Narrative  ? Not on file  ? ?Social Determinants of Health  ? ?Financial Resource Strain: Not on file  ?Food Insecurity: Not on file  ?Transportation Needs: Not on file   ?Physical Activity: Not on file  ?Stress: Not on file  ?Social Connections: Not on file  ?Intimate Partner Violence: Not on file  ?  ? ?Allergies  ?Allergen Reactions  ? Eggs Or Egg-Derived Products Diarrhea  ? Lactose Intolerance (Gi) Diarrhea  ? Lactulose Diarrhea  ? Oxycodone Hives  ? Oxycontin [Oxycodone Hcl] Hives  ? Doxycycline Rash  ?  Trunk rash (this is one of the 2 suspected sources)  ? Mixed Grasses Rash  ?  ? ?Outpatient Medications Prior to Visit  ?Medication Sig Dispense Refill  ? Adalimumab 40 MG/0.4ML PNKT Inject 40 mg into the skin every 7 (seven) days.    ? albuterol (PROVENTIL HFA;VENTOLIN HFA) 108 (90 Base) MCG/ACT inhaler Inhale 1-2 puffs into the lungs every 6 (six) hours as needed for wheezing. (Patient taking differently: Inhale 1-2 puffs into the lungs every 6 (six) hours as needed for wheezing or shortness of breath.) 1 Inhaler 0  ? albuterol (PROVENTIL) (2.5 MG/3ML) 0.083% nebulizer solution Take 3 mLs (2.5 mg total) by nebulization every 6 (six) hours as needed for wheezing or shortness of breath.  75 mL 0  ? azithromycin (ZITHROMAX) 1 g powder Take 1 g by mouth once.    ? cycloSPORINE (RESTASIS) 0.05 % ophthalmic emulsion Place 1 drop into both eyes daily at 12 noon.    ? gabapentin (NEURONTIN) 600 MG tablet Take 600 mg by mouth 3 (three) times daily as needed (for pain).     ? HYDROcodone-acetaminophen (NORCO/VICODIN) 5-325 MG tablet Take 1 tablet by mouth every 6 (six) hours as needed (for pain).     ? hydrOXYzine (ATARAX/VISTARIL) 25 MG tablet Take 25 mg by mouth at bedtime as needed for anxiety (or sleep).     ? ibuprofen (ADVIL) 200 MG tablet Take 200-800 mg by mouth every 6 (six) hours as needed for headache or mild pain.    ? levothyroxine (SYNTHROID) 150 MCG tablet Take 150 mcg by mouth daily before breakfast.    ? promethazine-dextromethorphan (PROMETHAZINE-DM) 6.25-15 MG/5ML syrup Take by mouth 4 (four) times daily as needed for cough.    ? tiZANidine (ZANAFLEX) 2 MG tablet  Take 2 mg by mouth 3 (three) times daily as needed for muscle spasms.    ? ?No facility-administered medications prior to visit.  ? ? ? ? ? ?Objective:  ? ?Physical Exam: ? ?General appearance: 36 y.o., female, female, NAD, conversant  ?Eyes: anicteric sclerae; PERRL, tracking appropriately ?HENT: NCAT; MMM ?Neck: Trachea midline; no lymphadenopathy, no JVD ?Lungs: CTAB, no crackles, no wheeze, with normal respiratory effort ?CV: RRR, no murmur  ?Abdomen: Soft, non-tender; non-distended, BS present  ?Extremities: No peripheral edema, warm ?Skin: Normal turgor and texture; no rash ?Psych: Appropriate affect ?Neuro: Alert and oriented to person and place, no focal deficit  ? ? ? ?There were no vitals filed for this visit. ?  on *** LPM *** RA ?BMI Readings from Last 3 Encounters:  ?07/10/21 28.66 kg/m?  ?11/17/19 28.29 kg/m?  ?01/12/19 26.29 kg/m?  ? ?Wt Readings from Last 3 Encounters:  ?07/10/21 172 lb 3.2 oz (78.1 kg)  ?11/17/19 170 lb (77.1 kg)  ?01/12/19 158 lb (71.7 kg)  ? ? ? ?CBC ?   ?Component Value Date/Time  ? WBC 8.3 06/28/2019 1335  ? RBC 5.02 06/28/2019 1335  ? HGB 14.5 06/28/2019 1335  ? HCT 42.9 06/28/2019 1335  ? PLT 337 06/28/2019 1335  ? MCV 85.5 06/28/2019 1335  ? MCH 28.9 06/28/2019 1335  ? MCHC 33.8 06/28/2019 1335  ? RDW 13.5 06/28/2019 1335  ? LYMPHSABS 2.5 04/23/2019 1304  ? MONOABS 0.4 04/23/2019 1304  ? EOSABS 0.2 04/23/2019 1304  ? BASOSABS 0.0 04/23/2019 1304  ? ? ?Eos 100-200 ? ?Chest Imaging: ?HRCT Chest 2018 reviewed by me with bronchial wall thickening and air trapping in lingula ? ?Pulmonary Functions Testing Results: ? ?  Latest Ref Rng & Units 05/26/2017  ? 10:53 AM  ?PFT Results  ?FVC-Pre L 2.75    ?FVC-Predicted Pre % 82    ?FVC-Post L 2.79    ?FVC-Predicted Post % 83    ?Pre FEV1/FVC % % 68    ?Post FEV1/FCV % % 78    ?FEV1-Pre L 1.88    ?FEV1-Predicted Pre % 67    ?FEV1-Post L 2.18    ?DLCO uncorrected ml/min/mmHg 25.85    ?DLCO UNC% % 98    ?DLCO corrected ml/min/mmHg 25.32    ?DLCO COR  %Predicted % 96    ?DLVA Predicted % 126    ?TLC L 4.56    ?TLC % Predicted % 86    ?RV % Predicted %  128    ? PFT 2019 reviewed by me with reversible mild obstruction, air trapping ? ?11/2019 PSG normal at 170lb ? ?FeNO: *** ? ?Pathology: *** ? ?Echocardiogram: *** ? ?Heart Catheterization: *** ?   ?Assessment & Plan:  ? ? ?Plan: ? ? ? ? ? ?Omar Person, MD ?[facility] ?08/11/2021 1:35 PM  ? ?

## 2021-08-13 ENCOUNTER — Institutional Professional Consult (permissible substitution): Payer: Medicaid Other | Admitting: Student

## 2021-08-26 ENCOUNTER — Ambulatory Visit: Payer: 59 | Admitting: Cardiology

## 2021-10-21 NOTE — Progress Notes (Signed)
Called pt to inform her about her monitor results. Pt understood and made an appt

## 2021-10-29 ENCOUNTER — Ambulatory Visit: Payer: 59 | Admitting: Cardiology

## 2021-12-16 ENCOUNTER — Ambulatory Visit: Payer: 59 | Admitting: Cardiology

## 2022-04-21 ENCOUNTER — Other Ambulatory Visit: Payer: Self-pay | Admitting: Nephrology

## 2022-04-21 DIAGNOSIS — N182 Chronic kidney disease, stage 2 (mild): Secondary | ICD-10-CM

## 2022-05-04 ENCOUNTER — Ambulatory Visit
Admission: RE | Admit: 2022-05-04 | Discharge: 2022-05-04 | Disposition: A | Discharge status: Home / Self Care | Payer: 59 | Source: Ambulatory Visit | Attending: Nephrology | Admitting: Nephrology

## 2022-05-04 DIAGNOSIS — N182 Chronic kidney disease, stage 2 (mild): Secondary | ICD-10-CM

## 2023-09-02 ENCOUNTER — Ambulatory Visit: Payer: 59 | Admitting: Internal Medicine

## 2023-09-02 ENCOUNTER — Encounter: Payer: Self-pay | Admitting: Internal Medicine

## 2023-09-02 VITALS — BP 110/80 | HR 82 | Ht 65.0 in | Wt 166.6 lb

## 2023-09-02 DIAGNOSIS — J4489 Other specified chronic obstructive pulmonary disease: Secondary | ICD-10-CM

## 2023-09-02 DIAGNOSIS — F17210 Nicotine dependence, cigarettes, uncomplicated: Secondary | ICD-10-CM

## 2023-09-02 DIAGNOSIS — J302 Other seasonal allergic rhinitis: Secondary | ICD-10-CM

## 2023-09-02 DIAGNOSIS — J45909 Unspecified asthma, uncomplicated: Secondary | ICD-10-CM | POA: Diagnosis not present

## 2023-09-02 DIAGNOSIS — F17200 Nicotine dependence, unspecified, uncomplicated: Secondary | ICD-10-CM

## 2023-09-02 LAB — POCT EXHALED NITRIC OXIDE: FeNO level (ppb): 5

## 2023-09-02 MED ORDER — ALBUTEROL SULFATE HFA 108 (90 BASE) MCG/ACT IN AERS: 1.0000 | INHALATION_SPRAY | Freq: Four times a day (QID) | RESPIRATORY_TRACT | 5 refills | Status: AC | PRN

## 2023-09-02 MED ORDER — LEVOCETIRIZINE DIHYDROCHLORIDE 5 MG PO TABS: 5.0000 mg | ORAL_TABLET | Freq: Every evening | ORAL | 3 refills | Status: AC

## 2023-09-02 MED ORDER — MOMETASONE FURO-FORMOTEROL FUM 200-5 MCG/ACT IN AERO: 2.0000 | INHALATION_SPRAY | Freq: Two times a day (BID) | RESPIRATORY_TRACT | 5 refills | Status: DC

## 2023-09-02 NOTE — Progress Notes (Signed)
 Janice Pena    [id number]    [birth year]  Primary Care Physician:Vanstory, Suzan Slick, PA  Referring Physician: Jackie Plum, MD [id number] [place],  [place] [address] Reason for Consultation: shortness of breath Date of Consultation: 09/02/2023  Chief complaint:   Chief Complaint  Patient presents with   Consult    Pt  states her doctor is concerned that she has copd. Pt also states she can't get mucus up and she's having sob. Sob started last month     HPI: Discussed the use of AI scribe software for clinical note transcription with the patient, who gave verbal consent to proceed.  History of Present Illness Janice Pena is a 38 year old female with asthma who presents with worsening breathing difficulties. She was referred by Janice Pena, her primary care provider, for evaluation of air pockets in her lungs and management of her asthma.  She has a history of asthma since childhood, with past episodes of hemoptysis. Over the past month, she has experienced worsening breathing difficulties, necessitating the use of her albuterol inhaler at least twice daily. The inhaler alleviates symptoms of shortness of breath, chest tightness, wheezing, and coughing.  She has previously been on Advair and Symbicort, with Symbicort being discontinued two years ago. Currently, she is not on any maintenance inhalers. Her asthma symptoms are exacerbated by allergies, which she describes as severe, with symptoms including itchy, watery eyes, runny nose, sore throat, and itchy ears. She has not yet filled her prescription for Xyzal, which was intended to manage her allergies.  She has a significant smoking history, having started at age 77. She has reduced her smoking from a pack and a half per day to half a pack per day since the beginning of the year. She plans to quit smoking by June when she turns 38.  Her medical history is complex, including rheumatoid  arthritis, kidney disease, hypothyroidism, and hypoparathyroidism. She is currently on Enbrel for rheumatoid arthritis, which predominantly affects her hands and feet but also impacts her entire body. She receives prednisone approximately twice a year for her arthritis, which also helps her breathing.  She lives with her family and has outdoor cats. She is not aware of any allergies to cats. She is on SSDI due to her multiple medical conditions.   Social History   Occupational History   Not on file  Tobacco Use   Smoking status: Every Day    Current packs/day: 0.50    Average packs/day: 1.5 packs/day for 22.3 years (33.1 ttl pk-yrs)    Types: Cigarettes    Start date: 2003   Smokeless tobacco: Never   Tobacco comments:    Smoke half a pack a day. [facility] 09/02/2023. Pt is trying to quit.  Vaping Use   Vaping status: Never Used  Substance and Sexual Activity   Alcohol use: No    Alcohol/week: 0.0 standard drinks of alcohol   Drug use: No   Sexual activity: Yes    Birth control/protection: Surgical    Relevant family history:  Family History  Problem Relation Age of Onset   Hypertension Mother    Hyperlipidemia Mother    Anxiety disorder Mother    Depression Mother    Hypertension Father    Hyperlipidemia Father    Allergies Father    Heart murmur Brother    Asthma Neg Hx     Past Medical History:  Diagnosis Date  Anxiety    Anxiety    Asthma    Asthma    Bilateral ovarian cysts    Fibromyalgia    GERD (gastroesophageal reflux disease)    HSV infection    Left wrist pain    Rheumatoid arteritis (HCC)    Vitamin D deficiency     Past Surgical History:  Procedure Laterality Date   ABDOMINAL HYSTERECTOMY     THYROIDECTOMY       Physical Exam: Blood pressure 110/80, pulse 82, height 5\' 5"  (1.651 m), weight 166 lb 9.6 oz (75.6 kg), SpO2 98%. Gen:      No acute distress ENT:  mallampati IV no nasal polyps, mucus membranes moist Lungs:    No increased  respiratory effort, symmetric chest wall excursion, clear to auscultation bilaterally, no wheezes or crackles CV:         Regular rate and rhythm; no murmurs, rubs, or gallops.  No pedal edema Abd:      + bowel sounds; soft, non-tender; no distension MSK: no acute synovitis of DIP or PIP joints, no mechanics hands.  Skin:      Warm and dry; no rashes Neuro: normal speech, no focal facial asymmetry Psych: alert and oriented x3, normal mood and affect   Data Reviewed/Medical Decision Making:  Independent interpretation of tests: Imaging:  Review of patient's ct chest images 2018 revealed mild peribronchial thickening, no emphysema. The patient's images have been independently reviewed by me.    PFTs: I have personally reviewed the patient's PFTs and normal pft in 2019    Latest Ref Rng & Units 05/26/2017   10:53 AM  PFT Results  FVC-Pre L 2.75   FVC-Predicted Pre % 82   FVC-Post L 2.79   FVC-Predicted Post % 83   Pre FEV1/FVC % % 68   Post FEV1/FCV % % 78   FEV1-Pre L 1.88   FEV1-Predicted Pre % 67   FEV1-Post L 2.18   DLCO uncorrected ml/min/mmHg 25.85   DLCO UNC% % 98   DLCO corrected ml/min/mmHg 25.32   DLCO COR %Predicted % 96   DLVA Predicted % 126   TLC L 4.56   TLC % Predicted % 86   RV % Predicted % 128     Labs:  Lab Results  Component Value Date   NA 138 06/28/2019   K 3.8 06/28/2019   CO2 27 06/28/2019   GLUCOSE 88 06/28/2019   BUN 6 06/28/2019   CREATININE 0.90 06/28/2019   CALCIUM 9.7 06/28/2019   GFRNONAA >60 06/28/2019   Lab Results  Component Value Date   WBC 8.3 06/28/2019   HGB 14.5 06/28/2019   HCT 42.9 06/28/2019   MCV 85.5 06/28/2019   PLT 337 06/28/2019     Immunization status:   There is no immunization history on file for this patient.   I reviewed prior external note(s) from cardiology,   I reviewed the result(s) of the labs and imaging as noted above.   I have ordered feno  Assessment & Plan Asthma-COPD Overlap  Syndrome uspected overlap syndrome due to asthma symptoms and smoking history. Smoking cessation crucial to prevent COPD progression. - Prescribed Dulera inhaler, two puffs BID, instructed to gargle post-use to prevent thrush. - Refilled albuterol inhaler. - Advised smoking cessation to prevent COPD progression. - Feno obtained today only 5 ppb - not elevated, less consistent with TH2 inflammation  Allergic Rhinitis Severe allergy symptoms likely contribute to asthma exacerbation.  - Refilled Xyzal prescription. - Discussed importance  of allergy control for asthma management.  Tobacco use disorder - contemplative with cessation  Smoking Cessation Counseling:  1. The patient is an everyday smoker and symptomatic due to the following condition copd 2. The patient is currently contemplative in quitting smoking. 3. I advised patient to quit smoking. 4. We identified patient specific barriers to change.  5. I personally spent 3 minutes counseling the patient regarding tobacco use disorder. 6. We discussed management of stress and anxiety to help with smoking cessation, when applicable. 7. We discussed nicotine replacement therapy, Wellbutrin, Chantix as possible options. 8. I advised setting a quit date. 9. Follow?up arranged with our office to continue ongoing discussions. 10.Resources given to patient including quit hotline.     Return to Care: Return in about 3 months (around 12/02/2023).  Louie Rover, MD Pulmonary and Critical Care Medicine [facility] Office:[phone]  CC: Tretha Fu, MD

## 2023-09-02 NOTE — Patient Instructions (Signed)
 It was a pleasure to see you today!  Please schedule follow up with myself in 3 months.  If my schedule is not open yet, we will contact you with a reminder closer to that time. Please call [phone] if you haven't heard from [place] a month before, and always call us sooner if issues or concerns arise. You can also send [place] a message through MyChart, but but aware that this is not to be used for urgent issues and it may take up to 5-7 days to receive a reply. Please be aware that you will likely be able to view your results before I have a chance to respond to them. Please give [place] 5 business days to respond to any non-urgent results.    YOUR PLAN:  -ASTHMA-COPD OVERLAP SYNDROME: Asthma-COPD Overlap Syndrome is a condition where asthma and chronic obstructive pulmonary disease (COPD) coexist. Your asthma symptoms are likely worsened by allergies and smoking. We have prescribed a Dulera inhaler (two puffs twice daily) and refilled your albuterol inhaler. It is important to gargle after using Dulera to prevent thrush. Smoking cessation is crucial to prevent the progression of COPD.  -ALLERGIC RHINITIS: Allergic rhinitis is an allergic reaction that causes sneezing, runny nose, and other similar symptoms. Your severe allergy symptoms are likely contributing to your asthma exacerbation. We have refilled your Xyzal prescription and discussed the importance of controlling your allergies to help manage your asthma.  By learning about asthma and how it can be controlled, you take an important step toward managing this disease. Work closely with your asthma care team to learn all you can about your asthma, how to avoid triggers, what your medications do, and how to take them correctly. With proper care, you can live free of asthma symptoms and maintain a normal, healthy lifestyle.   What is asthma? Asthma is a chronic disease that affects the airways of the lungs. During normal breathing, the bands of muscle  that surround the airways are relaxed and air moves freely. During an asthma episode or "attack," there are three main changes that stop air from moving easily through the airways: The bands of muscle that surround the airways tighten and make the airways narrow. This tightening is called bronchospasm.  The lining of the airways becomes swollen or inflamed.  The cells that line the airways produce more mucus, which is thicker than normal and clogs the airways.  These three factors - bronchospasm, inflammation, and mucus production - cause symptoms such as difficulty breathing, wheezing, and coughing.  What are the most common symptoms of asthma? Asthma symptoms are not the same for everyone. They can even change from episode to episode in the same person. Also, you may have only one symptom of asthma, such as cough, but another person may have all the symptoms of asthma. It is important to know all the symptoms of asthma and to be aware that your asthma can present in any of these ways at any time. The most common symptoms include: Coughing, especially at night  Shortness of breath  Wheezing  Chest tightness, pain, or pressure   Who is affected by asthma? Asthma affects 22 million Americans; about 6 million of these are children under age 86. People who have a family history of asthma have an increased risk of developing the disease. Asthma is also more common in people who have allergies or who are exposed to tobacco smoke. However, anyone can develop asthma at any time. Some people may have  asthma all of their lives, while others may develop it as adults.  What causes asthma? The airways in a person with asthma are very sensitive and react to many things, or "triggers." Contact with these triggers causes asthma symptoms. One of the most important parts of asthma control is to identify your triggers and then avoid them when possible. The only trigger you do not want to avoid is exercise.  Pre-treatment with medicines before exercise can allow you to stay active yet avoid asthma symptoms. Common asthma triggers include: Infections (colds, viruses, flu, sinus infections)  Exercise  Weather (changes in temperature and/or humidity, cold air)  Tobacco smoke  Allergens (dust mites, pollens, pets, mold spores, cockroaches, and sometimes foods)  Irritants (strong odors from cleaning products, perfume, wood smoke, air pollution)  Strong emotions such as crying or laughing hard  Some medications   How is asthma diagnosed? To diagnose asthma, your doctor will first review your medical history, family history, and symptoms. Your doctor will want to know any past history of breathing problems you may have had, as well as a family history of asthma, allergies, eczema (a bumpy, itchy skin rash caused by allergies), or other lung disease. It is important that you describe your symptoms in detail (cough, wheeze, shortness of breath, chest tightness), including when and how often they occur. The doctor will perform a physical examination and listen to your heart and lungs. He or she may also order breathing tests, allergy tests, blood tests, and chest and sinus X-rays. The tests will find out if you do have asthma and if there are any other conditions that are contributing factors.  How is asthma treated? Asthma can be controlled, but not cured. It is not normal to have frequent symptoms, trouble sleeping, or trouble completing tasks. Appropriate asthma care will prevent symptoms and visits to the emergency room and hospital. Asthma medicines are one of the mainstays of asthma treatment. The drugs used to treat asthma are explained below.  Anti-inflammatories: These are the most important drugs for most people with asthma. Anti-inflammatory drugs reduce swelling and mucus production in the airways. As a result, airways are less sensitive and less likely to react to triggers. These medications need to  be taken daily and may need to be taken for several weeks before they begin to control asthma. Anti-inflammatory medicines lead to fewer symptoms, better airflow, less sensitive airways, less airway damage, and fewer asthma attacks. If taken every day, they CONTROL or prevent asthma symptoms.   Bronchodilators: These drugs relax the muscle bands that tighten around the airways. This action opens the airways, letting more air in and out of the lungs and improving breathing. Bronchodilators also help clear mucus from the lungs. As the airways open, the mucus moves more freely and can be coughed out more easily. In short-acting forms, bronchodilators RELIEVE or stop asthma symptoms by quickly opening the airways and are very helpful during an asthma episode. In long-acting forms, bronchodilators provide CONTROL of asthma symptoms and prevent asthma episodes.  Asthma drugs can be taken in a variety of ways. Inhaling the medications by using a metered dose inhaler, dry powder inhaler, or nebulizer is one way of taking asthma medicines. Oral medicines (pills or liquids you swallow) may also be prescribed.  Asthma severity Asthma is classified as either "intermittent" (comes and goes) or "persistent" (lasting). Persistent asthma is further described as being mild, moderate, or severe. The severity of asthma is based on how often you have symptoms  both during the day and night, as well as by the results of lung function tests and by how well you can perform activities. The "severity" of asthma refers to how "intense" or "strong" your asthma is.  Asthma control Asthma control is the goal of asthma treatment. Regardless of your asthma severity, it may or may not be controlled. Asthma control means: You are able to do everything you want to do at work and home  You have no (or minimal) asthma symptoms  You do not wake up from your sleep or earlier than usual in the morning due to asthma  You rarely need to use  your reliever medicine (inhaler)  Another major part of your treatment is that you are happy with your asthma care and believe your asthma is controlled.  Monitoring symptoms A key part of treatment is keeping track of how well your lungs are working. Monitoring your symptoms, what they are, how and when they happen, and how severe they are, is an important part of being able to control your asthma.  Sometimes asthma is monitored using a peak flow meter. A peak flow (PF) meter measures how fast the air comes out of your lungs. It can help you know when your asthma is getting worse, sometimes even before you have symptoms. By taking daily peak flow readings, you can learn when to adjust medications to keep asthma under good control. It is also used to create your asthma action plan (see below). Your doctor can use your peak flow readings to adjust your treatment plan in some cases.  Asthma Action Plan Based on your history and asthma severity, you and your doctor will develop a care plan called an "asthma action plan." The asthma action plan describes when and how to use your medicines, actions to take when asthma worsens, and when to seek emergency care. Make sure you understand this plan. If you do not, ask your asthma care provider any questions you may have. Your asthma action plan is one of the keys to controlling asthma. Keep it readily available to remind you of what you need to do every day to control asthma and what you need to do when symptoms occur.  Goals of asthma therapy These are the goals of asthma treatment: Live an active, normal life  Prevent chronic and troublesome symptoms  Attend work or school every day  Perform daily activities without difficulty  Stop urgent visits to the doctor, emergency department, or hospital  Use and adjust medications to control asthma with few or no side effects

## 2023-10-12 ENCOUNTER — Telehealth: Payer: Self-pay | Admitting: Internal Medicine

## 2023-10-12 NOTE — Telephone Encounter (Signed)
 Copied from CRM [id number]. Topic: Appointments - Scheduling Inquiry for Clinic >> Oct 12, 2023  2:01 PM Tyronne Galloway wrote: Reason for CRM: Pt was referred by PA Vernette Goo for a sleep study. The referral is active. Please call the pt back for best options at [phone]

## 2023-10-12 NOTE — Telephone Encounter (Signed)
 Hi Dr. Dione Franks, Could you please confirm whether the patient needs a sleep study referral? If so, would you mind placing the order so we can proceed with scheduling?

## 2023-10-14 NOTE — Telephone Encounter (Signed)
 Looks like she had a negative sleep study in 2021

## 2023-10-15 NOTE — Telephone Encounter (Signed)
 Spoke with the patient, who stated that PA Odilia Bennett informed her last week that a sleep study was necessary due to nighttime sleep issues. The patient was also told by the [facility] representative that the referral was active and that she would be scheduled by us . I informed the patient that there is no referral or order for a sleep study with [facility]. She plans to follow up with the PA's office and ask them to contact us  directly.

## 2023-10-25 NOTE — Telephone Encounter (Signed)
 Per Dr. Dione Franks, patient had a negative sleep study in 2021.  There is no referral for sleep and no order for a sleep study.  Will await response from patient's MD office.

## 2023-10-26 NOTE — Telephone Encounter (Signed)
 I called and spoke with patient, she states that she was not seeing Dr. Dione Franks for sleep, she was seeing her for her lungs.  I advised her that Dr. Dione Franks said she had a negative sleep study in 2021 and asked if she recalled that.  The line went silent and then the call dropped.  I called her back and left her a VM.  I left a vm to call the office back once she got back from vacation.  It sounded like she was referring her for a sleep referral to see a sleep doctor.  I was trying to explain to her that sometimes she follows her pulmonary patients for sleep as well if they have sleep apnea, but I am not sure what she heard.  She did say that she was on vacation and she and her doctor were going to discuss what to do when she got back from vacation.

## 2023-11-11 NOTE — Telephone Encounter (Signed)
 Patient has a f/u on 7/24 with Dr. Meade, they can discuss sleep study at follow up.  Nothing further needed.

## 2023-11-23 ENCOUNTER — Other Ambulatory Visit (HOSPITAL_COMMUNITY): Payer: Self-pay

## 2023-11-23 MED ORDER — EUTHYROX 150 MCG PO TABS
150.0000 ug | ORAL_TABLET | Freq: Every day | ORAL | 2 refills | Status: DC
  Filled 2023-11-23 (×3): qty 30, 30d supply, fill #0

## 2023-11-30 ENCOUNTER — Other Ambulatory Visit: Payer: Self-pay | Admitting: Physician Assistant

## 2023-11-30 DIAGNOSIS — Z1231 Encounter for screening mammogram for malignant neoplasm of breast: Secondary | ICD-10-CM

## 2023-12-09 ENCOUNTER — Ambulatory Visit: Admitting: Internal Medicine

## 2023-12-29 ENCOUNTER — Ambulatory Visit
Admission: RE | Admit: 2023-12-29 | Discharge: 2023-12-29 | Disposition: A | Discharge status: Home / Self Care | Source: Ambulatory Visit | Attending: Physician Assistant | Admitting: Physician Assistant

## 2023-12-29 DIAGNOSIS — Z1231 Encounter for screening mammogram for malignant neoplasm of breast: Secondary | ICD-10-CM

## 2023-12-30 ENCOUNTER — Encounter: Payer: Self-pay | Admitting: Allergy & Immunology

## 2023-12-30 ENCOUNTER — Ambulatory Visit (INDEPENDENT_AMBULATORY_CARE_PROVIDER_SITE_OTHER): Admitting: Allergy & Immunology

## 2023-12-30 ENCOUNTER — Other Ambulatory Visit: Payer: Self-pay

## 2023-12-30 VITALS — BP 120/74 | HR 103 | Temp 98.1°F | Resp 18 | Ht 64.96 in | Wt 172.9 lb

## 2023-12-30 DIAGNOSIS — J4489 Other specified chronic obstructive pulmonary disease: Secondary | ICD-10-CM

## 2023-12-30 DIAGNOSIS — J310 Chronic rhinitis: Secondary | ICD-10-CM

## 2023-12-30 DIAGNOSIS — L508 Other urticaria: Secondary | ICD-10-CM

## 2023-12-30 NOTE — Progress Notes (Signed)
 NEW PATIENT  Date of Service/Encounter:  12/30/23  Consult requested by: Janice Rosina SAILOR, PA   Assessment:   Chronic urticaria   Chronic rhinitis  Asthma-COPD overlap with marked productive cough - previously followed by Dr. Meade  Rheumatoid arthritis (diagnosed in 2015, on Enbrel) - followed by Dr. Waddell  Hypothyroidism (diagnosed in 2020) - followed by Dr. Kermit  Secondary hyperparathyroidism (diagnosed in 2022) - followed by Dr. Kermit  Chronic kidney disease (stage 2)  Disabled status since 2021  Plan/Recommendations:   1. Chronic urticaria - Your history does not have any red flags such as fevers, joint pains, or permanent skin changes that would be concerning for a more serious cause of hives.  - We will get some labs to rule out serious causes of hives: alpha gal panel, complete blood count, tryptase level, chronic urticaria panel, CMP, ESR, and CRP. - We will do testing to the most common foods, which will rule out more than 95% of all food allergies (peanut, sesame, cashew, soy, fish mix, shellfish mix, wheat, milk, casein, egg). - Chronic hives are often times a self limited process and will burn themselves out over 6-12 months, although this is not always the case.  - In the meantime, start suppressive dosing of antihistamines:   - Morning: Xyzal  (levocetirizine) 5 mg (one tablet) + Pepcid (famotidine) 20mg   - Evening: Xyzal  (levocetirizine) 5 mg (one tablet) + Pepcid (famotidine) 20mg  - You can change this dosing at home, decreasing the dose as needed or increasing the dosing as needed.  - If you are not tolerating the medications or are tired of taking them every day, we can start treatment with a monthly injectable medication called Xolair, which might also help your breathing.   2. Asthma with COPD - Lung testing will be done next week (since our machines were being persnickety). - We are going to try changing you from Advair to Trelegy.  -  Trelegy contains three medications to help with your breathing AND it is only once daily. - Call us  to let us  know if you want a prescription sent in.  - we are getting some labs to look for serious difficult to control causes of asthma.  - Daily controller medication(s): Trelegy 200/62.5/25mcg one puff once daily - Prior to physical activity: albuterol  2 puffs 10-15 minutes before physical activity. - Rescue medications: albuterol  4 puffs every 4-6 hours as needed and albuterol  nebulizer one vial every 4-6 hours as needed - Asthma control goals:  * Full participation in all desired activities (may need albuterol  before activity) * Albuterol  use two time or less a week on average (not counting use with activity) * Cough interfering with sleep two time or less a month * Oral steroids no more than once a year * No hospitalizations  3. Chronic rhinitis - Because of insurance stipulations, we cannot do skin testing on the same day as your first visit. - We are all working to fight this, but for now we need to do two separate visits.  - We will know more after we do testing at the next visit.  - The skin testing visit can be squeezed in at your convenience.  - Then we can make a more full plan to address all of your symptoms. - Be sure to stop your antihistamines for 3 days before this appointment.   4. Return in about 1 week (around 01/06/2024) for SKIN TESTING (1-68). You can have the follow up appointment with Dr.  Iva or a Consulting civil engineer (our Nurse Practitioners are excellent and always have Geophysical data processor!).    This note in its entirety was forwarded to the Provider who requested this consultation.  Subjective:   Janice Pena is a 38 y.o. female presenting today for evaluation of  Chief Complaint  Patient presents with   Allergies    Primary care dr sent her here to find out what she is allergic to. 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  Discussed the use of AI scribe software for clinical note transcription with the patient and/or guardian, who gave verbal consent to proceed.  Janice Pena was referred by Janice Rosina SAILOR, PA.     Janice Pena is a 38 y.o. female presenting for an evaluation of asthma-COPD as well as hives and environmental allergies.  Asthma/Respiratory Symptom History: She reports that Dr. Meade diagnosed her with COPD this year, and she also has asthma. She uses Symbicort and an emergency inhaler every other day. She has a history of using Advair, which she found effective. She has not used her nebulizer in years but keeps it available. She has reduced her smoking from a pack a day to half a pack since October.  She had a high-resolution chest CT in 2018 when she was followed by Dr. Geronimo.  She has not seen Dr. Geronimo in several years.  She is not sure why she stopped seeing him.  He did see Dr. Meade, but the patient did not feel that she showed a lot of care about her breathing issues.  She also put her on Dulera which she specifically told her had that worked in the past.  1. No evidence of interstitial lung disease. 2. Diffuse bronchial wall thickening. Air trapping in the lingula. Findings are compatible with the provided history of asthma.  Allergic Rhinitis Symptom History: She has never undergone environmental allergy  testing.  She does not use anything on a routine basis for her allergies.  She would like to get allergy  testing done to see if this is contributing to her itching and asthma issues.  Food Allergy  Symptom History: She is lactose intolerant and has a sensitivity to eggs, which cause gastrointestinal discomfort when consumed directly.  Skin Symptom History: She  experiences itchy hives that appear after physical exertion, which she reports have spread up her neck and tried to spread to her face. She has been on steroids, including prednisone  and dexamethasone, for six to seven months, which initially alleviated the symptoms, but they have since recurred. She also uses a topical cream prescribed by her doctor. She suspects her symptoms may be related to water exposure, as she experiences small itchy bumps on her fingers, which she believes are related to her water supply.  In 2019, an MRI was performed to rule out multiple sclerosis due to headaches, which she believes were actually caused by her thyroid  issues. She is on disability since 2021 and previously worked as a Facilities manager until 2022.  She has a history of rheumatoid arthritis, diagnosed at age 30, and is currently on Enbrel. She mentions difficulty administering the injection due to her husband's mental health issues, which sometimes prevents him from assisting her.  She has hypothyroidism, diagnosed in 2020, and underwent  thyroidectomy in 2022. She also has secondary hyperparathyroidism, diagnosed in 2023, and stage 2 kidney disease.   She lives with her 46 year old son and has a supportive relationship with the family of a former client. She recently traveled to [place]  with her son and friends, which she describes as a positive experience.   Otherwise, there is no history of other atopic diseases, including drug allergies, stinging insect allergies, or contact dermatitis. There is no significant infectious history. Vaccinations are up to date.    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 If you have any questions, ask your nurse or doctor.          STOP taking these medications    cycloSPORINE 0.05 % ophthalmic emulsion Commonly known as: RESTASIS Stopped by: Marty Morton Shaggy   Euthyrox  150 MCG tablet Generic drug: levothyroxine  Stopped by: Marty Morton Shaggy   ibuprofen  200 MG tablet Commonly known as: ADVIL  Stopped by: Marty Morton Shaggy   levothyroxine  150 MCG tablet Commonly known as: SYNTHROID  Stopped by: Marty Morton Shaggy   mometasone -formoterol  200-5 MCG/ACT Aero Commonly known as: DULERA Stopped by: Marty Morton Shaggy   promethazine-dextromethorphan 6.25-15 MG/5ML syrup Commonly known as: PROMETHAZINE-DM Stopped by: Marty Morton Shaggy       TAKE these medications    albuterol  (2.5 MG/3ML) 0.083% nebulizer solution Commonly known as: PROVENTIL  Take 3 mLs (2.5 mg total) by nebulization every 6 (six) hours as needed for wheezing or shortness of breath.   albuterol  108 (90 Base) MCG/ACT inhaler Commonly known as: VENTOLIN  HFA Inhale 1-2 puffs into the lungs every 6 (six) hours as needed for wheezing or shortness of breath.   azithromycin  1 g powder Commonly known as: ZITHROMAX  Take 1 g by mouth once.   etanercept 25 MG/0.5ML prefilled syringe Commonly known as: ENBREL Inject into the skin.   gabapentin 600 MG tablet Commonly known as: NEURONTIN Take 600 mg by mouth 3 (three) times daily as needed (for pain).   HYDROcodone -acetaminophen  5-325 MG tablet Commonly known as: NORCO/VICODIN Take 1 tablet by mouth every 6 (six) hours as needed (for pain).   hydrOXYzine  25 MG tablet Commonly known as: ATARAX  Take 25 mg by mouth at bedtime as needed for anxiety (or sleep).   levocetirizine 5 MG tablet Commonly known as: XYZAL  Take 1 tablet (5 mg total)  by mouth every evening.   tiZANidine 2 MG tablet Commonly known as: ZANAFLEX Take 2 mg by mouth 3 (three) times daily as needed for muscle spasms.   triamcinolone lotion 0.1 % Commonly known as: KENALOG 1 app applied topically 3 times a day; Duration: 7 days        Birth History: non-contributory  Developmental History: non-contributory  Past Surgical History: Past Surgical History:  Procedure Laterality Date   ABDOMINAL HYSTERECTOMY     THYROIDECTOMY       Family History: Family History  Problem Relation Age  of Onset   Hypertension Mother    Hyperlipidemia Mother    Anxiety disorder Mother    Depression Mother    Hypertension Father    Hyperlipidemia Father    Allergies Father    Heart murmur Brother    Asthma Neg Hx      Social History: Shreshta lives at home with her family.  Lives in a house that was built in the 1950s.  There is wood throughout the home.  She has electric heating and window units for cooling.  There are cats and dogs outside of the home.  There are no dust mite covers on the bedding.  There is tobacco exposure.  There is no HEPA filter in the home.  They do not live in an interstate or industrial area.  She is a smoker in the house in the car.  She is smoked since 2003.   Review of systems otherwise negative other than that mentioned in the HPI.    Objective:   Blood pressure 120/74, pulse (!) 103, temperature 98.1 F (36.7 C), temperature source Temporal, resp. rate 18, height 5' 4.96 (1.65 m), weight 172 lb 14.4 oz (78.4 kg), SpO2 98%. Body mass index is 28.81 kg/m.     Physical Exam Vitals reviewed.  Constitutional:      Appearance: She is well-developed.     Comments: Very pleasant.  Talkative.  HENT:     Head: Normocephalic and atraumatic.     Right Ear: Tympanic membrane, ear canal and external ear normal. No drainage, swelling or tenderness. Tympanic membrane is not injected, scarred, erythematous, retracted or bulging.      Left Ear: Tympanic membrane, ear canal and external ear normal. No drainage, swelling or tenderness. Tympanic membrane is not injected, scarred, erythematous, retracted or bulging.     Nose: No nasal deformity, septal deviation, mucosal edema or rhinorrhea.     Right Turbinates: Enlarged, swollen and pale.     Left Turbinates: Enlarged, swollen and pale.     Right Sinus: No maxillary sinus tenderness or frontal sinus tenderness.     Left Sinus: No maxillary sinus tenderness or frontal sinus tenderness.     Comments: No polyps.  Clear rhinorrhea.    Mouth/Throat:     Mouth: Mucous membranes are not pale and not dry.     Pharynx: Uvula midline.  Eyes:     General:        Right eye: No discharge.        Left eye: No discharge.     Conjunctiva/sclera: Conjunctivae normal.     Right eye: Right conjunctiva is not injected. No chemosis.    Left eye: Left conjunctiva is not injected. No chemosis.    Pupils: Pupils are equal, round, and reactive to light.  Cardiovascular:     Rate and Rhythm: Normal rate and regular rhythm.     Heart sounds: Normal heart sounds.  Pulmonary:     Effort: Pulmonary effort is normal. No tachypnea, accessory muscle usage or respiratory distress.     Breath sounds: Normal breath sounds. No wheezing, rhonchi or rales.     Comments: Moving air well in all lung fields.  No increased work of breathing. Chest:     Chest wall: No tenderness.  Abdominal:     Tenderness: There is no abdominal tenderness. There is no guarding or rebound.  Lymphadenopathy:     Head:     Right side of head: No submandibular, tonsillar or occipital adenopathy.  Left side of head: No submandibular, tonsillar or occipital adenopathy.     Cervical: No cervical adenopathy.  Skin:    Coloration: Skin is not pale.     Findings: No abrasion, erythema, petechiae or rash. Rash is not papular, urticarial or vesicular.  Neurological:     Mental Status: She is alert.  Psychiatric:         Behavior: Behavior is cooperative.      Diagnostic studies: labs sent instead        Marty Shaggy, MD [facility] 

## 2023-12-30 NOTE — Patient Instructions (Addendum)
 1. Chronic urticaria - Your history does not have any red flags such as fevers, joint pains, or permanent skin changes that would be concerning for a more serious cause of hives.  - We will get some labs to rule out serious causes of hives: alpha gal panel, complete blood count, tryptase level, chronic urticaria panel, CMP, ESR, and CRP. - We will do testing to the most common foods, which will rule out more than 95% of all food allergies (peanut, sesame, cashew, soy, fish mix, shellfish mix, wheat, milk, casein, egg). - Chronic hives are often times a self limited process and will burn themselves out over 6-12 months, although this is not always the case.  - In the meantime, start suppressive dosing of antihistamines:   - Morning: Xyzal  (levocetirizine) 5 mg (one tablet) + Pepcid (famotidine) 20mg   - Evening: Xyzal  (levocetirizine) 5 mg (one tablet) + Pepcid (famotidine) 20mg  - You can change this dosing at home, decreasing the dose as needed or increasing the dosing as needed.  - If you are not tolerating the medications or are tired of taking them every day, we can start treatment with a monthly injectable medication called Xolair, which might also help your breathing.   2. Asthma with COPD - Lung testing will be done next week (since our machines were being persnickety). - We are going to try changing you from Advair to Trelegy.  - Trelegy contains three medications to help with your breathing AND it is only once daily. - Call us  to let us  know if you want a prescription sent in.  - we are getting some labs to look for serious difficult to control causes of asthma.  - Daily controller medication(s): Trelegy 200/62.5/25mcg one puff once daily - Prior to physical activity: albuterol  2 puffs 10-15 minutes before physical activity. - Rescue medications: albuterol  4 puffs every 4-6 hours as needed and albuterol  nebulizer one vial every 4-6 hours as needed - Asthma control goals:  * Full  participation in all desired activities (may need albuterol  before activity) * Albuterol  use two time or less a week on average (not counting use with activity) * Cough interfering with sleep two time or less a month * Oral steroids no more than once a year * No hospitalizations  3. Chronic rhinitis - Because of insurance stipulations, we cannot do skin testing on the same day as your first visit. - We are all working to fight this, but for now we need to do two separate visits.  - We will know more after we do testing at the next visit.  - The skin testing visit can be squeezed in at your convenience.  - Then we can make a more full plan to address all of your symptoms. - Be sure to stop your antihistamines for 3 days before this appointment.   4. Return in about 1 week (around 01/06/2024) for SKIN TESTING (1-68). You can have the follow up appointment with Dr. Iva or a Nurse Practicioner (our Nurse Practitioners are excellent and always have Physician oversight!).    Please inform us  of any Emergency Department visits, hospitalizations, or changes in symptoms. Call us  before going to the ED for breathing or allergy  symptoms since we might be able to fit you in for a sick visit. Feel free to contact us  anytime with any questions, problems, or concerns.  It was a pleasure to meet you today!  Websites that have reliable patient information: 1. American Academy of Asthma, Allergy ,  and Immunology: www.aaaai.org 2. Food Allergy  Research and Education (FARE): foodallergy.org 3. Mothers of Asthmatics: http://www.asthmacommunitynetwork.org 4. [employer] of Allergy , Asthma, and Immunology: www.acaai.org      "Like" us  on Facebook and Instagram for our latest updates!      A healthy democracy works best when [employer] participate! Make sure you are registered to vote! If you have moved or changed any of your contact information, you will need to get this updated before voting!  Scan the QR codes below to learn more!

## 2024-01-03 LAB — ALPHA-GAL PANEL
Allergen Lamb IgE: <0.1 kU/L
Beef IgE: <0.1 kU/L
IgE (Immunoglobulin E), Serum: 20 [IU]/mL (ref 6–495)
O215-IgE Alpha-Gal: <0.1 kU/L
Pork IgE: <0.1 kU/L

## 2024-01-05 ENCOUNTER — Ambulatory Visit: Payer: Self-pay | Admitting: Allergy & Immunology

## 2024-01-06 ENCOUNTER — Ambulatory Visit: Admitting: Allergy & Immunology

## 2024-01-06 MED ORDER — TRELEGY ELLIPTA 200-62.5-25 MCG/ACT IN AEPB: INHALATION_SPRAY | RESPIRATORY_TRACT | 3 refills | Status: AC

## 2024-01-07 LAB — C-REACTIVE PROTEIN: CRP: 11 mg/L — ABNORMAL HIGH (ref 0–10)

## 2024-01-07 LAB — CBC WITH DIFFERENTIAL/PLATELET
Basophils Absolute: 0.1 x10E3/uL (ref 0.0–0.2)
Basos: 1 %
EOS (ABSOLUTE): 0.1 x10E3/uL (ref 0.0–0.4)
Eos: 1 %
Hematocrit: 45.2 % (ref 34.0–46.6)
Hemoglobin: 15.3 g/dL (ref 11.1–15.9)
Immature Grans (Abs): 0 x10E3/uL (ref 0.0–0.1)
Immature Granulocytes: 0 %
Lymphocytes Absolute: 2.4 x10E3/uL (ref 0.7–3.1)
Lymphs: 29 %
MCH: 29.2 pg (ref 26.6–33.0)
MCHC: 33.8 g/dL (ref 31.5–35.7)
MCV: 86 fL (ref 79–97)
Monocytes Absolute: 0.5 x10E3/uL (ref 0.1–0.9)
Monocytes: 6 %
Neutrophils Absolute: 5.3 x10E3/uL (ref 1.4–7.0)
Neutrophils: 63 %
Platelets: 340 x10E3/uL (ref 150–450)
RBC: 5.24 x10E6/uL (ref 3.77–5.28)
RDW: 13.2 % (ref 11.7–15.4)
WBC: 8.3 x10E3/uL (ref 3.4–10.8)

## 2024-01-07 LAB — TRYPTASE: Tryptase: 4.8 ug/L (ref 2.2–13.2)

## 2024-01-07 LAB — CMP14+EGFR
ALT: 12 IU/L (ref 0–32)
AST: 15 IU/L (ref 0–40)
Albumin: 4.3 g/dL (ref 3.9–4.9)
Alkaline Phosphatase: 125 IU/L — ABNORMAL HIGH (ref 44–121)
BUN/Creatinine Ratio: 5 — ABNORMAL LOW (ref 9–23)
BUN: 5 mg/dL — ABNORMAL LOW (ref 6–24)
Bilirubin Total: 0.3 mg/dL (ref 0.0–1.2)
CO2: 21 mmol/L (ref 20–29)
Calcium: 10.2 mg/dL (ref 8.7–10.2)
Chloride: 102 mmol/L (ref 96–106)
Creatinine, Ser: 1.03 mg/dL — ABNORMAL HIGH (ref 0.57–1.00)
Globulin, Total: 2.8 g/dL (ref 1.5–4.5)
Glucose: 84 mg/dL (ref 70–99)
Potassium: 4.5 mmol/L (ref 3.5–5.2)
Sodium: 137 mmol/L (ref 134–144)
Total Protein: 7.1 g/dL (ref 6.0–8.5)
eGFR: 70 mL/min/1.73 (ref >59)

## 2024-01-07 LAB — ANTINUCLEAR ANTIBODIES, IFA: ANA Titer 1: NEGATIVE

## 2024-01-07 LAB — ASPERGILLUS PRECIPITINS

## 2024-01-07 LAB — CHRONIC URTICARIA PD-BAT: Pooled Donor- BAT CU: 6.49 (ref 0.00–10.60)

## 2024-01-07 LAB — ANCA TITERS
Atypical pANCA: 1:80 {titer} — AB
C-ANCA: <1:20 {titer}
P-ANCA: <1:20 {titer}

## 2024-01-07 LAB — ALPHA-1-ANTITRYPSIN: A-1 Antitrypsin: 151 mg/dL (ref 100–188)

## 2024-01-07 LAB — SEDIMENTATION RATE: Sed Rate: 36 mm/h — ABNORMAL HIGH (ref 0–32)

## 2024-01-07 LAB — IGE: IgE (Immunoglobulin E), Serum: 23 [IU]/mL (ref 6–495)

## 2024-01-18 ENCOUNTER — Ambulatory Visit: Admitting: Allergy & Immunology
